# Patient Record
Sex: Male | Born: 1980 | Race: White | Hispanic: No | Smoking: Current every day smoker
Health system: Southern US, Community
[De-identification: ages and names within clinical notes are randomized; demographics above are authoritative.]

## PROBLEM LIST (undated history)

## (undated) DIAGNOSIS — R569 Unspecified convulsions: Secondary | ICD-10-CM

---

## 2004-08-03 ENCOUNTER — Emergency Department: Payer: Self-pay | Admitting: Emergency Medicine

## 2004-08-03 ENCOUNTER — Other Ambulatory Visit: Payer: Self-pay

## 2005-08-12 ENCOUNTER — Emergency Department: Payer: Self-pay | Admitting: Emergency Medicine

## 2007-05-31 ENCOUNTER — Emergency Department: Payer: Self-pay | Admitting: Emergency Medicine

## 2008-09-26 ENCOUNTER — Emergency Department: Payer: Self-pay | Admitting: Emergency Medicine

## 2008-09-29 ENCOUNTER — Emergency Department: Payer: Self-pay | Admitting: Internal Medicine

## 2010-07-27 ENCOUNTER — Emergency Department: Payer: Self-pay | Admitting: Emergency Medicine

## 2011-04-21 ENCOUNTER — Emergency Department: Payer: Self-pay | Admitting: Emergency Medicine

## 2011-04-22 ENCOUNTER — Emergency Department: Payer: Self-pay | Admitting: Emergency Medicine

## 2011-09-15 ENCOUNTER — Emergency Department: Payer: Self-pay | Admitting: Emergency Medicine

## 2011-12-18 ENCOUNTER — Emergency Department: Payer: Self-pay | Admitting: *Deleted

## 2012-08-12 ENCOUNTER — Emergency Department: Payer: Self-pay | Admitting: Internal Medicine

## 2012-09-11 ENCOUNTER — Emergency Department: Payer: Self-pay | Admitting: Emergency Medicine

## 2013-02-16 ENCOUNTER — Emergency Department: Payer: Self-pay | Admitting: Emergency Medicine

## 2013-02-16 LAB — COMPREHENSIVE METABOLIC PANEL
Alkaline Phosphatase: 68 U/L (ref 50–136)
BUN: 15 mg/dL (ref 7–18)
Chloride: 108 mmol/L — ABNORMAL HIGH (ref 98–107)
Co2: 31 mmol/L (ref 21–32)
EGFR (African American): >60
EGFR (Non-African Amer.): >60
Osmolality: 285 (ref 275–301)
SGOT(AST): 19 U/L (ref 15–37)
SGPT (ALT): 27 U/L (ref 12–78)
Sodium: 142 mmol/L (ref 136–145)

## 2013-02-16 LAB — CBC
HCT: 48.9 % (ref 40.0–52.0)
HGB: 16.7 g/dL (ref 13.0–18.0)
MCH: 31.5 pg (ref 26.0–34.0)
MCHC: 34.1 g/dL (ref 32.0–36.0)
MCV: 92 fL (ref 80–100)
RBC: 5.3 10*6/uL (ref 4.40–5.90)
WBC: 12 10*3/uL — ABNORMAL HIGH (ref 3.8–10.6)

## 2013-02-16 LAB — ETHANOL: Ethanol: <3 mg/dL

## 2014-11-15 ENCOUNTER — Emergency Department: Payer: Self-pay | Admitting: Emergency Medicine

## 2015-04-30 ENCOUNTER — Emergency Department
Admission: EM | Admit: 2015-04-30 | Discharge: 2015-04-30 | Disposition: A | Discharge status: Home / Self Care | Payer: Self-pay | Attending: Emergency Medicine | Admitting: Emergency Medicine

## 2015-04-30 ENCOUNTER — Encounter: Payer: Self-pay | Admitting: Emergency Medicine

## 2015-04-30 DIAGNOSIS — K625 Hemorrhage of anus and rectum: Secondary | ICD-10-CM | POA: Insufficient documentation

## 2015-04-30 DIAGNOSIS — Z720 Tobacco use: Secondary | ICD-10-CM | POA: Insufficient documentation

## 2015-04-30 LAB — COMPREHENSIVE METABOLIC PANEL
ALT: 27 U/L (ref 17–63)
AST: 23 U/L (ref 15–41)
Albumin: 4.3 g/dL (ref 3.5–5.0)
Alkaline Phosphatase: 63 U/L (ref 38–126)
Anion gap: 7 (ref 5–15)
BUN: 13 mg/dL (ref 6–20)
CO2: 29 mmol/L (ref 22–32)
Calcium: 9.7 mg/dL (ref 8.9–10.3)
Chloride: 107 mmol/L (ref 101–111)
Creatinine, Ser: 1.05 mg/dL (ref 0.61–1.24)
Glucose, Bld: 93 mg/dL (ref 65–99)
POTASSIUM: 4.6 mmol/L (ref 3.5–5.1)
Sodium: 143 mmol/L (ref 135–145)
TOTAL PROTEIN: 7.1 g/dL (ref 6.5–8.1)

## 2015-04-30 LAB — CBC
HEMATOCRIT: 46.3 % (ref 40.0–52.0)
Hemoglobin: 15.4 g/dL (ref 13.0–18.0)
MCH: 31 pg (ref 26.0–34.0)
MCHC: 33.2 g/dL (ref 32.0–36.0)
MCV: 93.6 fL (ref 80.0–100.0)
Platelets: 280 10*3/uL (ref 150–440)
RBC: 4.95 MIL/uL (ref 4.40–5.90)
RDW: 13.3 % (ref 11.5–14.5)
WBC: 10.6 10*3/uL (ref 3.8–10.6)

## 2015-04-30 MED ORDER — DOCUSATE SODIUM 100 MG PO CAPS: 100.0000 mg | ORAL_CAPSULE | Freq: Every day | ORAL | Status: AC | PRN

## 2015-04-30 MED ORDER — HYDROCORTISONE ACETATE 25 MG RE SUPP: 25.0000 mg | Freq: Two times a day (BID) | RECTAL | Status: DC

## 2015-04-30 NOTE — ED Notes (Signed)
Pt presents with rectal bleeding after having BM, been going on for one year and has not been seen by a DR for it.

## 2015-04-30 NOTE — ED Provider Notes (Signed)
[facility] Emergency Department Provider Note     Time seen: ----------------------------------------- 7:03 PM on 04/30/2015 -----------------------------------------    I have reviewed the triage vital signs and the nursing notes.   HISTORY  Chief Complaint Rectal Bleeding    HPI Jeffrey Bryan is a 34 y.o. male who presents to ER with rectal bleeding after having a bowel movement. Patient states been going on for about a year off and on. Patient has never seen a doctor for this, bleeding has been significant at times, doesn't think he's lost a couple blood. Patient denies any other complaints. Has had some discomfort rectally but no severe pain.   History reviewed. No pertinent past medical history.  There are no active problems to display for this patient.   History reviewed. No pertinent past surgical history.  Allergies Tramadol  Social History Social History  Substance Use Topics  . Smoking status: Current Some Day Smoker  . Smokeless tobacco: None  . Alcohol Use: Yes    Review of Systems Constitutional: Negative for fever. Eyes: Negative for visual changes. ENT: Negative for sore throat. Cardiovascular: Negative for chest pain. Respiratory: Negative for shortness of breath. Gastrointestinal: Negative for abdominal pain, vomiting and diarrhea. Positive for rectal bleeding Genitourinary: Negative for dysuria. Musculoskeletal: Negative for back pain. Skin: Negative for rash. Neurological: Negative for headaches, focal weakness or numbness.  10-point ROS otherwise negative.  ____________________________________________   PHYSICAL EXAM:  VITAL SIGNS: ED Triage Vitals  Enc Vitals Group     BP 04/30/15 1808 123/86 mmHg     Pulse Rate 04/30/15 1808 84     Resp 04/30/15 1808 18     Temp 04/30/15 1808 98.4 F (36.9 C)     Temp Source 04/30/15 1808 Oral     SpO2 04/30/15 1808 79 %     Weight 04/30/15 1808 145 lb (65.772 kg)      Height 04/30/15 1808  (1.6 m)     Head Cir --      Peak Flow --      Pain Score --      Pain Loc --      Pain Edu? --      Excl. in GC? --     Constitutional: Alert and oriented. Well appearing and in no distress. Eyes: Conjunctivae are normal. PERRL. Normal extraocular movements. ENT   Head: Normocephalic and atraumatic.   Nose: No congestion/rhinnorhea.   Mouth/Throat: Mucous membranes are moist.   Neck: No stridor. Cardiovascular: Normal rate, regular rhythm. Normal and symmetric distal pulses are present in all extremities. No murmurs, rubs, or gallops. Respiratory: Normal respiratory effort without tachypnea nor retractions. Breath sounds are clear and equal bilaterally. No wheezes/rales/rhonchi. Gastrointestinal: Soft and nontender. No distention. No abdominal bruits.  Rectal: No internal or external hemorrhoids, heme positive stool. Nontender. Musculoskeletal: Nontender with normal range of motion in all extremities. No joint effusions.  No lower extremity tenderness nor edema. Neurologic:  Normal speech and language. No gross focal neurologic deficits are appreciated. Speech is normal. No gait instability. Skin:  Skin is warm, dry and intact. No rash noted. Psychiatric: Mood and affect are normal. Speech and behavior are normal. Patient exhibits appropriate insight and judgment.  ____________________________________________  ED COURSE:  Pertinent labs & imaging results that were available during my care of the patient were reviewed by me and considered in my medical decision making (see chart for details). We'll check basic labs and reevaluate. ____________________________________________    LABS (pertinent  positives/negatives)  Labs Reviewed  COMPREHENSIVE METABOLIC PANEL - Abnormal; Notable for the following:    Total Bilirubin <0.1 (*)    All other components within normal limits  CBC  POC OCCULT BLOOD, ED    ____________________________________________  FINAL ASSESSMENT AND PLAN  Rectal bleeding  Plan: Patient with labs as dictated above. No clear etiology for his symptoms. He was heme positive, we'll try Anusol HC for anal outlet bleeding and referred to GI for follow-up.   Emily Filbert, MD   Emily Filbert, MD 04/30/15 [phone]

## 2015-04-30 NOTE — Discharge Instructions (Signed)
Rectal Bleeding °Rectal bleeding is when blood passes out of the anus. It is usually a sign that something is wrong. It may not be serious, but it should always be evaluated. Rectal bleeding may present as bright red blood or extremely dark stools. The color may range from dark red or maroon to black (like tar). It is important that the cause of rectal bleeding be identified so treatment can be started and the problem corrected. °CAUSES  °· Hemorrhoids. These are enlarged (dilated) blood vessels or veins in the anal or rectal area. °· Fistulas. These are abnormal, burrowing channels that usually run from inside the rectum to the skin around the anus. They can bleed. °· Anal fissures. This is a tear in the tissue of the anus. Bleeding occurs with bowel movements. °· Diverticulosis. This is a condition in which pockets or sacs project from the bowel wall. Occasionally, the sacs can bleed. °· Diverticulitis. This is an infection involving diverticulosis of the colon. °· Proctitis and colitis. These are conditions in which the rectum, colon, or both, can become inflamed and pitted (ulcerated). °· Polyps and cancer. Polyps are non-cancerous (benign) growths in the colon that may bleed. Certain types of polyps turn into cancer. °· Protrusion of the rectum. Part of the rectum can project from the anus and bleed. °· Certain medicines. °· Intestinal infections. °· Blood vessel abnormalities. °HOME CARE INSTRUCTIONS °· Eat a high-fiber diet to keep your stool soft. °· Limit activity. °· Drink enough fluids to keep your urine clear or pale yellow. °· Warm baths may be useful to soothe rectal pain. °· Follow up with your caregiver as directed. °SEEK IMMEDIATE MEDICAL CARE IF: °· You develop increased bleeding. °· You have black or dark red stools. °· You vomit blood or material that looks like coffee grounds. °· You have abdominal pain or tenderness. °· You have a fever. °· You feel weak, nauseous, or you faint. °· You have  severe rectal pain or you are unable to have a bowel movement. °MAKE SURE YOU: °· Understand these instructions. °· Will watch your condition. °· Will get help right away if you are not doing well or get worse. °Document Released: 01/29/2002 Document Revised: 11/01/2011 Document Reviewed: 01/24/2011 °ExitCare® Patient Information ©2015 [employer]. This information is not intended to replace advice given to you by your health care provider. Make sure you discuss any questions you have with your health care provider. ° °

## 2015-08-08 ENCOUNTER — Emergency Department
Admission: EM | Admit: 2015-08-08 | Discharge: 2015-08-09 | Disposition: A | Discharge status: Home / Self Care | Payer: Self-pay | Attending: Emergency Medicine | Admitting: Emergency Medicine

## 2015-08-08 ENCOUNTER — Encounter: Payer: Self-pay | Admitting: *Deleted

## 2015-08-08 ENCOUNTER — Emergency Department: Payer: Self-pay

## 2015-08-08 DIAGNOSIS — Z79899 Other long term (current) drug therapy: Secondary | ICD-10-CM | POA: Insufficient documentation

## 2015-08-08 DIAGNOSIS — G40909 Epilepsy, unspecified, not intractable, without status epilepticus: Secondary | ICD-10-CM | POA: Insufficient documentation

## 2015-08-08 DIAGNOSIS — F17210 Nicotine dependence, cigarettes, uncomplicated: Secondary | ICD-10-CM | POA: Insufficient documentation

## 2015-08-08 DIAGNOSIS — R569 Unspecified convulsions: Secondary | ICD-10-CM

## 2015-08-08 HISTORY — DX: Unspecified convulsions: R56.9

## 2015-08-08 NOTE — ED Notes (Signed)
Patient states this is his 4th seizure. He has never been on any medication and states, "I just thought they would go away".

## 2015-08-08 NOTE — ED Notes (Signed)
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 Pt was told "my eyes rolled back in head and I began shaking" time and length of seizure is unknown. Pt reports vomiting 4-5 times after seizure. Pt reports having a headache at this time. No neuro deficits known at this time. Pt reports having a hx of seizures ut denies taking medication at this time.

## 2015-08-08 NOTE — ED Notes (Signed)
Patient to CT.

## 2015-08-08 NOTE — ED Notes (Signed)
Patient returned from CT

## 2015-08-08 NOTE — ED Provider Notes (Signed)
[facility] Emergency Department Provider Note   ____________________________________________  Time seen:  I have reviewed the triage vital signs and the triage nursing note.  HISTORY  Chief Complaint Seizures   Historian Patient, mother, fianc  HPI Jeffrey Bryan is a 34 y.o. male who is here for evaluation of a seizure. 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 He has had about 4 seizures total, but has not had an evaluation for these. The last seizure he had was in August and before that was one year prior. He's not had head imaging in the past. He does not have insurance, and does not have a primary care physician and has never seen a neurologist. He has never taken epileptic medications.    Past Medical History  Diagnosis Date  . Seizures (HCC)     There are no active problems to display for this patient.   History reviewed. No pertinent past surgical history.  Current Outpatient Rx  Name  Route  Sig  Dispense  Refill  . docusate sodium (COLACE) 100 MG capsule   Oral   Take 1 capsule (100 mg total) by mouth daily as needed.   30 capsule   2   . hydrocortisone (ANUSOL-HC) 25 MG suppository   Rectal   Place 1 suppository (25 mg total) rectally 2 (two) times daily.   12 suppository   0     Allergies Tramadol  History reviewed. No pertinent family history.  Social History Social History  Substance Use Topics  . Smoking status: Current Every Day Smoker -- 1.00 packs/day    Types: Cigarettes  . Smokeless tobacco: None  . Alcohol Use: Yes    Review of Systems  Constitutional: Negative for fever. Eyes: Negative for visual changes. ENT: Negative for sore throat. Cardiovascular: Negative for chest pain. Respiratory: Negative for shortness of breath. Gastrointestinal: Negative for abdominal pain, vomiting and  diarrhea. Genitourinary: Negative for dysuria. Musculoskeletal: Negative for back pain. Skin: Negative for rash. Neurological: Negative for headache. 10 point Review of Systems otherwise negative ____________________________________________   PHYSICAL EXAM:  VITAL SIGNS: ED Triage Vitals  Enc Vitals Group     BP 08/08/15 2013 109/67 mmHg     Pulse Rate 08/08/15 2013 61     Resp 08/08/15 2013 18     Temp 08/08/15 2013 98.2 F (36.8 C)     Temp Source 08/08/15 2013 Oral     SpO2 08/08/15 2013 98 %     Weight 08/08/15 2013 145 lb (65.772 kg)     Height 08/08/15 2013  (1.626 m)     Head Cir --      Peak Flow --      Pain Score 08/08/15 2025 5     Pain Loc --      Pain Edu? --      Excl. in GC? --      Constitutional: Alert and oriented. Well appearing and in no distress. Eyes: Conjunctivae are normal. PERRL. Normal extraocular movements. ENT   Head: Normocephalic and atraumatic.   Nose: No congestion/rhinnorhea.   Mouth/Throat: Mucous membranes are moist.   Neck: No stridor. Cardiovascular/Chest: Normal rate, regular rhythm.  No murmurs, rubs, or gallops. Respiratory: Normal respiratory effort without tachypnea nor retractions. Breath sounds are clear and equal bilaterally. No wheezes/rales/rhonchi. Gastrointestinal: Soft. No distention, no guarding, no rebound. Nontender  Genitourinary/rectal:Deferred Musculoskeletal: Nontender with normal range of motion in all extremities. No joint effusions.  No lower extremity tenderness.  No edema. Neurologic:  Normal speech and language. No gross or focal neurologic deficits are appreciated. Skin:  Skin is warm, dry and intact. No rash noted. Psychiatric: Mood and affect are normal. Speech and behavior are normal. Patient exhibits appropriate insight and judgment.  ____________________________________________   EKG I, Governor Rooksebecca Ellice Boultinghouse, MD, the attending physician have personally viewed and interpreted all  ECGs.  None ____________________________________________  LABS (pertinent positives/negatives)  None  ____________________________________________  RADIOLOGY All Xrays were viewed by me. Imaging interpreted by Radiologist.  CT head: No acute intracranial abnormality __________________________________________  PROCEDURES  Procedure(s) performed: None  Critical Care performed: None  ____________________________________________   ED COURSE / ASSESSMENT AND PLAN  CONSULTATIONS: [facility], Dr. Amada JupiterKirkpatrick, who recommended starting the patient on Keppra versus Dilantin depending on the patient's report.  Pertinent labs & imaging results that were available during my care of the patient were reviewed by me and considered in my medical decision making (see chart for details).   Patient is here after probably his fourth seizure, but 2 seizures within the past 6 months. No additional high risk features, however the patient has not had underlying base imaging of his head, and he does not have insurance and is unlikely to be out of obtain an MRI as an outpatient, so we discussed obtaining head CT tonight.  CT head is negative.  Patient denies any withdrawal or illicit drug use that should be the source of his seizure.  I discussed this case with the neurologist, Dr. Amada JupiterKirkpatrick, who did recommend starting the patient on antibiotic. Recommended Keppra primarily, but patient cannot afford this, to could use Dilantin.  Using the good Rx coupon, patient should be able to get Keppra 1 month supply for about $25. Patient states he should be admitted to this.  I've referred him for [facility], and [facility], or [facility].  Patient / Family / Caregiver informed of clinical course, medical decision-making process, and agree with plan.   I discussed return precautions, follow-up instructions, and  discharged instructions with patient and/or family.  ___________________________________________   FINAL CLINICAL IMPRESSION(S) / ED DIAGNOSES   Final diagnoses:  Seizure (HCC)       Governor Rooksebecca Dulcey Riederer, MD 08/09/15 0030

## 2015-08-08 NOTE — ED Notes (Signed)
Patients family member states he has a bump on the back of his head that she would like to be evaluated. States patients father died of a brain tumor. Patient states it has been there his whole life and only hurts "sometimes".

## 2015-08-09 MED ORDER — LEVETIRACETAM 500 MG PO TABS
500.0000 mg | ORAL_TABLET | Freq: Once | ORAL | Status: AC
  Administered 2015-08-09: 500 mg via ORAL
  Filled 2015-08-09: qty 1

## 2015-08-09 MED ORDER — LEVETIRACETAM 500 MG PO TABS: 500.0000 mg | ORAL_TABLET | Freq: Two times a day (BID) | ORAL | Status: DC

## 2015-08-09 NOTE — Discharge Instructions (Signed)
You were evaluated after seizure, and you're being started on seizure medication. Next time we discussed, you need to follow up with a primary care physician and are referred to the [facility].  You also should see a neurologist, you may either see [facility], or [facility].  Return to the emergency department for a seizure within the next week 4 hours, or any seizure that lasts longer than 5 minutes, or where you do not come back to normal mental status within about 10 minutes.   Seizure, Adult A seizure means there is unusual activity in the brain. A seizure can cause changes in attention or behavior. Seizures often cause shaking (convulsions). Seizures often last from 30 seconds to 2 minutes. HOME CARE   If you are given medicines, take them exactly as told by your doctor.  Keep all doctor visits as told.  Do not swim or drive until your doctor says it is okay.  Teach others what to do if you have a seizure. They should:  Lay you on the ground.  Put a cushion under your head.  Loosen any tight clothing around your neck.  Turn you on your side.  Stay with you until you get better. GET HELP RIGHT AWAY IF:   The seizure lasts longer than 2 to 5 minutes.  The seizure is very bad.  The person does not wake up after the seizure.  The person's attention or behavior changes. Drive the person to the emergency room or call your local emergency services (911 in [place] MAKE SURE YOU:   Understand these instructions.  Will watch your condition.  Will get help right away if you are not doing well or get worse.   This information is not intended to replace advice given to you by your health care provider. Make sure you discuss any questions you have with your health care provider.   Document Released: 01/26/2008 Document Revised: 11/01/2011 Document Reviewed: 03/21/2013 Elsevier Interactive Patient Education [employer]

## 2015-12-13 ENCOUNTER — Encounter: Payer: Self-pay | Admitting: Urgent Care

## 2015-12-13 ENCOUNTER — Emergency Department
Admission: EM | Admit: 2015-12-13 | Discharge: 2015-12-13 | Disposition: A | Discharge status: Home / Self Care | Payer: Self-pay | Attending: Emergency Medicine | Admitting: Emergency Medicine

## 2015-12-13 DIAGNOSIS — Z79899 Other long term (current) drug therapy: Secondary | ICD-10-CM | POA: Insufficient documentation

## 2015-12-13 DIAGNOSIS — F1290 Cannabis use, unspecified, uncomplicated: Secondary | ICD-10-CM | POA: Insufficient documentation

## 2015-12-13 DIAGNOSIS — K029 Dental caries, unspecified: Secondary | ICD-10-CM

## 2015-12-13 DIAGNOSIS — K0889 Other specified disorders of teeth and supporting structures: Secondary | ICD-10-CM

## 2015-12-13 DIAGNOSIS — F17210 Nicotine dependence, cigarettes, uncomplicated: Secondary | ICD-10-CM | POA: Insufficient documentation

## 2015-12-13 MED ORDER — KETOROLAC TROMETHAMINE 60 MG/2ML IM SOLN
60.0000 mg | Freq: Once | INTRAMUSCULAR | Status: AC
  Administered 2015-12-13: 60 mg via INTRAMUSCULAR
  Filled 2015-12-13: qty 2

## 2015-12-13 NOTE — ED Notes (Signed)
Reviewed d/c instructions, follow-up care with pt. Pt  Verbalized understanding

## 2015-12-13 NOTE — ED Notes (Signed)
Webster, MD at bedside. 

## 2015-12-13 NOTE — Discharge Instructions (Signed)
Dental Caries °Dental caries (also called tooth decay) is the most common oral disease. It can occur at any age but is more common in children and young adults.  °HOW DENTAL CARIES DEVELOPS  °The process of decay begins when bacteria and foods (particularly sugars and starches) combine in your mouth to produce plaque. Plaque is a substance that sticks to the hard, outer surface of a tooth (enamel). The bacteria in plaque produce acids that attack enamel. These acids may also attack the root surface of a tooth (cementum) if it is exposed. Repeated attacks dissolve these surfaces and create holes in the tooth (cavities). If left untreated, the acids destroy the other layers of the tooth.  °RISK FACTORS °· Frequent sipping of sugary beverages.   °· Frequent snacking on sugary and starchy foods, especially those that easily get stuck in the teeth.   °· Poor oral hygiene.   °· Dry mouth.   °· Substance abuse such as methamphetamine abuse.   °· Broken or poor-fitting dental restorations.   °· Eating disorders.   °· Gastroesophageal reflux disease (GERD).   °· Certain radiation treatments to the head and neck. °SYMPTOMS °In the early stages of dental caries, symptoms are seldom present. Sometimes white, chalky areas may be seen on the enamel or other tooth layers. In later stages, symptoms may include: °· Pits and holes on the enamel. °· Toothache after sweet, hot, or cold foods or drinks are consumed. °· Pain around the tooth. °· Swelling around the tooth. °DIAGNOSIS  °Most of the time, dental caries is detected during a regular dental checkup. A diagnosis is made after a thorough medical and dental history is taken and the surfaces of your teeth are checked for signs of dental caries. Sometimes special instruments, such as lasers, are used to check for dental caries. Dental X-ray exams may be taken so that areas not visible to the eye (such as between the contact areas of the teeth) can be checked for cavities.    °TREATMENT  °If dental caries is in its early stages, it may be reversed with a fluoride treatment or an application of a remineralizing agent at the dental office. Thorough brushing and flossing at home is needed to aid these treatments. If it is in its later stages, treatment depends on the location and extent of tooth destruction:  °· If a small area of the tooth has been destroyed, the destroyed area will be removed and cavities will be filled with a material such as gold, silver amalgam, or composite resin.   °· If a large area of the tooth has been destroyed, the destroyed area will be removed and a cap (crown) will be fitted over the remaining tooth structure.   °· If the center part of the tooth (pulp) is affected, a procedure called a root canal will be needed before a filling or crown can be placed.   °· If most of the tooth has been destroyed, the tooth may need to be pulled (extracted). °HOME CARE INSTRUCTIONS °You can prevent, stop, or reverse dental caries at home by practicing good oral hygiene. Good oral hygiene includes: °· Thoroughly cleaning your teeth at least twice a day with a toothbrush and dental floss.   °· Using a fluoride toothpaste. A fluoride mouth rinse may also be used if recommended by your dentist or health care provider.   °· Restricting the amount of sugary and starchy foods and sugary liquids you consume.   °· Avoiding frequent snacking on these foods and sipping of these liquids.   °· Keeping regular visits with   a dentist for checkups and cleanings. PREVENTION   Practice good oral hygiene.  Consider a dental sealant. A dental sealant is a coating material that is applied by your dentist to the pits and grooves of teeth. The sealant prevents food from being trapped in them. It may protect the teeth for several years.  Ask about fluoride supplements if you live in a community without fluorinated water or with water that has a low fluoride content. Use fluoride supplements  as directed by your dentist or health care provider.  Allow fluoride varnish applications to teeth if directed by your dentist or health care provider.   This information is not intended to replace advice given to you by your health care provider. Make sure you discuss any questions you have with your health care provider.   Document Released: 05/01/2002 Document Revised: 08/30/2014 Document Reviewed: 08/11/2012 Elsevier Interactive Patient Education 2016 Elsevier Inc.  Dental Pain Dental pain may be caused by many things, including:  Tooth decay (cavities or caries). Cavities expose the nerve of your tooth to air and hot or cold temperatures. This can cause pain or discomfort.  Abscess or infection. A dental abscess is a collection of infected pus from a bacterial infection in the inner part of the tooth (pulp). It usually occurs at the end of the tooth's root.  Injury.  An unknown reason (idiopathic). Your pain may be mild or severe. It may only occur when:  You are chewing.  You are exposed to hot or cold temperature.  You are eating or drinking sugary foods or beverages, such as soda or candy. Your pain may also be constant. HOME CARE INSTRUCTIONS Watch your dental pain for any changes. The following actions may help to lessen any discomfort that you are feeling:  Take medicines only as directed by your dentist.  If you were prescribed an antibiotic medicine, finish all of it even if you start to feel better.  Keep all follow-up visits as directed by your dentist. This is important.  Do not apply heat to the outside of your face.  Rinse your mouth or gargle with salt water if directed by your dentist. This helps with pain and swelling.  You can make salt water by adding  tsp of salt to 1 cup of warm water.  Apply ice to the painful area of your face:  Put ice in a plastic bag.  Place a towel between your skin and the bag.  Leave the ice on for 20 minutes, 2-3  times per day.  Avoid foods or drinks that cause you pain, such as:  Very hot or very cold foods or drinks.  Sweet or sugary foods or drinks. SEEK MEDICAL CARE IF:  Your pain is not controlled with medicines.  Your symptoms are worse.  You have new symptoms. SEEK IMMEDIATE MEDICAL CARE IF:  You are unable to open your mouth.  You are having trouble breathing or swallowing.  You have a fever.  Your face, neck, or jaw is swollen.   This information is not intended to replace advice given to you by your health care provider. Make sure you discuss any questions you have with your health care provider.   Document Released: 08/09/2005 Document Revised: 12/24/2014 Document Reviewed: 08/05/2014 Elsevier Interactive Patient Education 2016 [employer].  OPTIONS FOR DENTAL FOLLOW UP CARE  [place] Department of Health and Human Services - Local Safety Net Dental Clinics [email or web address]   [facility] [phone])  Sharl Ma [phone])  [facility] [phone])  [facility] [phone])  [facility] [phone]) This clinic caters to the indigent population and is on a lottery system. Location: [employer] [address], [address], [place] [facility] Hours: Wednesdays from 6pm - 9pm, patients seen by a lottery system. For dates, call or go to [email or web address] Services: Cleanings, fillings and simple extractions. Payment Options: DENTAL WORK IS FREE OF CHARGE. Bring proof of income or support. Best way to get seen: Arrive at 5:15 pm - this is a lottery, NOT first come/first serve, so arriving earlier will not increase your chances of being seen.     [facility] [phone] Select option 1 for emergencies   Location: [facility], [address], [address],  [place] [facility] Hours: No walk-ins accepted - call the day before to schedule an appointment. Check in times are 9:30 am and 1:30 pm. Services: Simple extractions, temporary fillings, pulpectomy/pulp debridement, uncomplicated abscess drainage. Payment Options: PAYMENT IS DUE AT THE TIME OF SERVICE.  Fee is usually $100-200, additional surgical procedures (e.g. abscess drainage) may be extra. Cash, checks, Visa/MasterCard accepted.  Can file Medicaid if patient is covered for dental - patient should call case worker to check. No discount for [facility] patients. Best way to get seen: MUST call the day before and get onto the schedule. Can usually be seen the next 1-2 days. No walk-ins accepted.     [facility] [phone]   Location: [facility], [address], [place] [facility] Hours: M, W, Th, F 8am or 1:30pm, Tues 9a or 1:30 - first come/first served. Services: Simple extractions, temporary fillings, uncomplicated abscess drainage.  You do not need to be an [facility] resident. Payment Options: PAYMENT IS DUE AT THE TIME OF SERVICE. Dental insurance, otherwise sliding scale - bring proof of income or support. Depending on income and treatment needed, cost is usually $50-200. Best way to get seen: Arrive early as it is first come/first served.     [facility] [phone]   Location: [address] Clinic Hours: Mon-Thu 8a-5p Services: Most basic dental services including extractions and fillings. Payment Options: PAYMENT IS DUE AT THE TIME OF SERVICE. Sliding scale, up to 50% off - bring proof if income or support. Medicaid with dental option accepted. Best way to get seen: Call to schedule an appointment, can usually be seen within 2 weeks OR they will try to see walk-ins - show up at 8a or 2p (you may have to wait).     [facility] [phone] [place]  RESIDENTS ONLY   Location: [facility], [address], [place], [place] [address] Clinic Hours: By appointment only. Monday - Thursday 8am-5pm, Friday 8am-12pm Services: Cleanings, fillings, extractions. Payment Options: PAYMENT IS DUE AT THE TIME OF SERVICE. Cash, Visa or MasterCard. Sliding scale - $30 minimum per service. Best way to get seen: Come in to office, complete packet and make an appointment - need proof of income or support monies for each household member and proof of [facility] [place] residence. Usually takes about a month to get in.     [facility] [phone]   Location: [address] [facility] Hours: Walk-in Urgent Care Dental Services are offered Monday-Friday mornings only. The numbers of emergencies accepted daily is limited to the number of providers available. Maximum 15 - Mondays, Wednesdays &  Thursdays Maximum 10 - Tuesdays & Fridays Services: You do not need to be a [facility] resident to be seen for a dental emergency. Emergencies are defined as pain, swelling, abnormal bleeding, or dental trauma. Walkins will receive x-rays if needed. NOTE: Dental cleaning is not an emergency. Payment Options: PAYMENT IS DUE AT THE TIME OF SERVICE. Minimum co-pay is $40.00 for uninsured patients. Minimum co-pay is $3.00 for Medicaid with dental coverage. Dental Insurance is accepted and must be presented at time of visit. Medicare does not cover dental. Forms of payment: Cash, credit card, checks. Best way to get seen: If not previously registered with the clinic, walk-in dental registration begins at 7:15 am and is on a first come/first serve basis. If previously registered with the clinic, call to make an appointment.     [facility] [phone] [place] RESIDENTS ONLY   Location: [address] [place], [place] Clinic Hours: Mon-Thu 10a-2p Services: Extractions only! Payment  Options: FREE (donations accepted) - bring proof of income or support Best way to get seen: Call and schedule an appointment OR come at 8am on the 1st Monday of every month (except for holidays) when it is first come/first served.     [facility] [phone]   Location: [address] [place], [place] Clinic Hours: Friday mornings Services, Payment Options, Best way to get seen: Call for info

## 2015-12-13 NOTE — ED Notes (Signed)
Patient presents with c/o RIGHT lower jaw pain x 3 weeks. Patient denies fever. No appreciable swelling in triage. Visitor states, "He needs it taken out". RN explained to patient and visitor that the ED did not have a dentist on staff, therefore he would be referred for removal. No local or preferred dentist.

## 2015-12-13 NOTE — ED Provider Notes (Signed)
[facility] Emergency Department Provider Note  ____________________________________________  Time seen: Approximately 142 AM  I have reviewed the triage vital signs and the nursing notes.   HISTORY  Chief Complaint Dental Pain    HPI Jeffrey Bryan is a 35 y.o. male who comes in with some dental pain. The patient is having some left upper tooth pain. He reports that he's been having this pain for 3 weeks. He reports that tonight it became unbearable. The patient reports that he has been taking ibuprofen for the pain but it has not been helping. He reports that takes only for a few minutes and then the pain comes back. He reports that he tries to hold his breath and blood pressure on it but the pain returns. The patient's pain is a 9 out of 10 in intensity. He denies any fevers or any other symptoms. The patient is here because he wants to have this tooth evaluated.   Past Medical History  Diagnosis Date  . Seizures (HCC)     There are no active problems to display for this patient.   History reviewed. No pertinent past surgical history.  Current Outpatient Rx  Name  Route  Sig  Dispense  Refill  . docusate sodium (COLACE) 100 MG capsule   Oral   Take 1 capsule (100 mg total) by mouth daily as needed.   30 capsule   2   . hydrocortisone (ANUSOL-HC) 25 MG suppository   Rectal   Place 1 suppository (25 mg total) rectally 2 (two) times daily.   12 suppository   0   . levETIRAcetam (KEPPRA) 500 MG tablet   Oral   Take 1 tablet (500 mg total) by mouth 2 (two) times daily.   60 tablet   0     Allergies Tramadol  No family history on file.  Social History Social History  Substance Use Topics  . Smoking status: Current Every Day Smoker -- 1.00 packs/day    Types: Cigarettes  . Smokeless tobacco: None  . Alcohol Use: Yes    Review of Systems Constitutional: No fever/chills Eyes: No visual changes. ENT: dental pain Cardiovascular:  Denies chest pain. Respiratory: Denies shortness of breath. Gastrointestinal: No abdominal pain.  No nausea, no vomiting.  No diarrhea.  No constipation. Genitourinary: Negative for dysuria. Musculoskeletal: Negative for back pain. Skin: Negative for rash. Neurological: Negative for headaches, focal weakness or numbness.  10-point ROS otherwise negative.  ____________________________________________   PHYSICAL EXAM:  VITAL SIGNS: ED Triage Vitals  Enc Vitals Group     BP 12/13/15 0014 127/79 mmHg     Pulse Rate 12/13/15 0014 81     Resp 12/13/15 0014 20     Temp 12/13/15 0014 97.8 F (36.6 C)     Temp Source 12/13/15 0014 Oral     SpO2 12/13/15 0014 99 %     Weight 12/13/15 0014 150 lb (68.04 kg)     Height 12/13/15 0014  (1.626 m)     Head Cir --      Peak Flow --      Pain Score 12/13/15 0014 10     Pain Loc --      Pain Edu? --      Excl. in GC? --     Constitutional: Alert and oriented. Well appearing and in Moderate distress. Eyes: Conjunctivae are normal. PERRL. EOMI. Head: Atraumatic. Nose: No congestion/rhinnorhea. Mouth/Throat: Mucous membranes are moist.  Oropharynx non-erythematous. Broken tooth at third  molar on the right maxillary jaw Cardiovascular: Normal rate, regular rhythm. Grossly normal heart sounds.  Good peripheral circulation. Respiratory: Normal respiratory effort.  No retractions. Lungs CTAB. Gastrointestinal: Soft and nontender. No distention. Positive bowel sounds Musculoskeletal: No lower extremity tenderness nor edema.   Neurologic:  Normal speech and language.  Skin:  Skin is warm, dry and intact.  Psychiatric: Mood and affect are normal.   ____________________________________________   LABS (all labs ordered are listed, but only abnormal results are displayed)  Labs Reviewed - No data to  display ____________________________________________  EKG  None ____________________________________________  RADIOLOGY  None ____________________________________________   PROCEDURES  Procedure(s) performed: None  Critical Care performed: No  ____________________________________________   INITIAL IMPRESSION / ASSESSMENT AND PLAN / ED COURSE  Pertinent labs & imaging results that were available during my care of the patient were reviewed by me and considered in my medical decision making (see chart for details).  This is a 35 year old male who comes into the hospital today with some dental pain. As were looking through the patient's care is aware we noted that the [facility] with [facility] same pain on 418. The patient did not disclose that he had been seen until I asked him. The patient was also given 15 hydrocodone as well as amoxicillin and ibuprofen while there. The [facility]. They report that he did call but was placed on the waiting list. I informed him that I can give him a dose of Toradol but because he did not disclose he had been taking narcotic medication and he has completed them before the allotted date I am unable to give him any further narcotics. The patient is still taking his amoxicillin as well. I encouraged him to [facility]. The patient will be discharged home. ____________________________________________   FINAL CLINICAL IMPRESSION(S) / ED DIAGNOSES  Final diagnoses:  Pain due to dental caries  Pain, dental      Rebecka ApleyAllison P Asmar Brozek, MD 12/13/15 [phone]

## 2016-02-01 ENCOUNTER — Encounter: Payer: Self-pay | Admitting: Emergency Medicine

## 2016-02-01 ENCOUNTER — Emergency Department
Admission: EM | Admit: 2016-02-01 | Discharge: 2016-02-01 | Disposition: A | Discharge status: Home / Self Care | Payer: Self-pay | Attending: Emergency Medicine | Admitting: Emergency Medicine

## 2016-02-01 ENCOUNTER — Emergency Department: Payer: Self-pay

## 2016-02-01 DIAGNOSIS — F1290 Cannabis use, unspecified, uncomplicated: Secondary | ICD-10-CM | POA: Insufficient documentation

## 2016-02-01 DIAGNOSIS — Z8669 Personal history of other diseases of the nervous system and sense organs: Secondary | ICD-10-CM | POA: Insufficient documentation

## 2016-02-01 DIAGNOSIS — Y999 Unspecified external cause status: Secondary | ICD-10-CM | POA: Insufficient documentation

## 2016-02-01 DIAGNOSIS — W1789XA Other fall from one level to another, initial encounter: Secondary | ICD-10-CM | POA: Insufficient documentation

## 2016-02-01 DIAGNOSIS — F17210 Nicotine dependence, cigarettes, uncomplicated: Secondary | ICD-10-CM | POA: Insufficient documentation

## 2016-02-01 DIAGNOSIS — S8001XA Contusion of right knee, initial encounter: Secondary | ICD-10-CM | POA: Insufficient documentation

## 2016-02-01 DIAGNOSIS — Y92009 Unspecified place in unspecified non-institutional (private) residence as the place of occurrence of the external cause: Secondary | ICD-10-CM | POA: Insufficient documentation

## 2016-02-01 DIAGNOSIS — Y939 Activity, unspecified: Secondary | ICD-10-CM | POA: Insufficient documentation

## 2016-02-01 MED ORDER — NABUMETONE 750 MG PO TABS: 750.0000 mg | ORAL_TABLET | Freq: Two times a day (BID) | ORAL | Status: DC

## 2016-02-01 MED ORDER — OXYCODONE-ACETAMINOPHEN 5-325 MG PO TABS
ORAL_TABLET | ORAL | Status: AC
  Filled 2016-02-01: qty 1

## 2016-02-01 MED ORDER — CYCLOBENZAPRINE HCL 5 MG PO TABS: 5.0000 mg | ORAL_TABLET | Freq: Three times a day (TID) | ORAL | Status: DC | PRN

## 2016-02-01 MED ORDER — OXYCODONE-ACETAMINOPHEN 5-325 MG PO TABS
1.0000 | ORAL_TABLET | ORAL | Status: DC | PRN
  Administered 2016-02-01: 1 via ORAL

## 2016-02-01 NOTE — Discharge Instructions (Signed)
Contusion A contusion is a deep bruise. Contusions happen when an injury causes bleeding under the skin. Symptoms of bruising include pain, swelling, and discolored skin. The skin may turn blue, purple, or yellow. HOME CARE   Rest the injured area.  If told, put ice on the injured area.  Put ice in a plastic bag.  Place a towel between your skin and the bag.  Leave the ice on for 20 minutes, 2-3 times per day.  If told, put light pressure (compression) on the injured area using an elastic bandage. Make sure the bandage is not too tight. Remove it and put it back on as told by your doctor.  If possible, raise (elevate) the injured area above the level of your heart while you are sitting or lying down.  Take over-the-counter and prescription medicines only as told by your doctor. GET HELP IF:  Your symptoms do not get better after several days of treatment.  Your symptoms get worse.  You have trouble moving the injured area. GET HELP RIGHT AWAY IF:   You have very bad pain.  You have a loss of feeling (numbness) in a hand or foot.  Your hand or foot turns pale or cold.   This information is not intended to replace advice given to you by your health care provider. Make sure you discuss any questions you have with your health care provider.   Document Released: 01/26/2008 Document Revised: 04/30/2015 Document Reviewed: 12/25/2014 Elsevier Interactive Patient Education 2016 [employer]   Take the prescription med as directed. Apply ice to reduce symptoms. Wear the ace wrap for comfort. [facility] as needed.

## 2016-02-01 NOTE — ED Notes (Signed)
Patient states that he fell off his porch onto his right knee yesterday. Patient reports taking tylenol and ibu with no relief.

## 2016-02-02 NOTE — ED Provider Notes (Signed)
[facility] Emergency Department Provider Note ____________________________________________  Time seen: 2024  I have reviewed the triage vital signs and the nursing notes.  HISTORY  Chief Complaint  Knee Pain  HPI Jeffrey Bryan is a 35 y.o. male presents to the ED for evaluation of injury sustained 1 day after he claims to have fallen off of his deck at home. He describes he was outside smoking a cigarette on the deck, but denies any preceding injury, accident, or sprain. He describes falling forward landing primarily on the right knee. He reports he's had pain and disability since that time. He is present with his wife for evaluation of right knee pain, and rates his pain at 7/10 in triage. He denies any other injury at this time.  Past Medical History  Diagnosis Date  . Seizures (HCC)     There are no active problems to display for this patient.   History reviewed. No pertinent past surgical history.  Current Outpatient Rx  Name  Route  Sig  Dispense  Refill  . cyclobenzaprine (FLEXERIL) 5 MG tablet   Oral   Take 1 tablet (5 mg total) by mouth every 8 (eight) hours as needed for muscle spasms.   12 tablet   0   . docusate sodium (COLACE) 100 MG capsule   Oral   Take 1 capsule (100 mg total) by mouth daily as needed.   30 capsule   2   . hydrocortisone (ANUSOL-HC) 25 MG suppository   Rectal   Place 1 suppository (25 mg total) rectally 2 (two) times daily.   12 suppository   0   . levETIRAcetam (KEPPRA) 500 MG tablet   Oral   Take 1 tablet (500 mg total) by mouth 2 (two) times daily.   60 tablet   0   . nabumetone (RELAFEN) 750 MG tablet   Oral   Take 1 tablet (750 mg total) by mouth 2 (two) times daily.   30 tablet   0     Allergies Tramadol  No family history on file.  Social History Social History  Substance Use Topics  . Smoking status: Current Every Day Smoker -- 1.00 packs/day    Types: Cigarettes  . Smokeless tobacco:  None  . Alcohol Use: Yes     Comment: rarely    Review of Systems  Constitutional: Negative for fever. Musculoskeletal: Negative for back pain. Right knee pain as above Skin: Negative for rash. Neurological: Negative for headaches, focal weakness or numbness. ____________________________________________  PHYSICAL EXAM:  VITAL SIGNS: ED Triage Vitals  Enc Vitals Group     BP 02/01/16 1926 117/72 mmHg     Pulse Rate 02/01/16 1926 76     Resp 02/01/16 1926 18     Temp 02/01/16 1926 98 F (36.7 C)     Temp Source 02/01/16 2047 Oral     SpO2 02/01/16 1926 97 %     Weight 02/01/16 1926 135 lb (61.236 kg)     Height 02/01/16 1926  (1.651 m)     Head Cir --      Peak Flow --      Pain Score 02/01/16 1927 7     Pain Loc --      Pain Edu? --      Excl. in GC? --    Constitutional: Alert and oriented. Well appearing and in no distress. Head: Normocephalic and atraumatic. Cardiovascular: Normal distal pulses. Respiratory: Normal respiratory effort.  Musculoskeletal: Right knee  without any obvious deformity, effusion, dislocation, or abrasion. The patient is noted to have normal flexion and extension range of motion. No valgus or varus joint stress is appreciated. Negative anterior/ posterior drawer. Normal patellar tracking without laxity or crepitus. No popliteal space fullness is appreciated. No calf or Achilles tenderness is noted. Nontender with normal range of motion in all extremities.  Neurologic:  Mildly antalgic gait without ataxia. Normal speech and language. No gross focal neurologic deficits are appreciated. Skin:  Skin is warm, dry and intact. No rash noted. No bruise, ecchymosis, abrasion, or laceration.  ____________________________________________   RADIOLOGY  Right Knee IMPRESSION: No evidence of fracture or dislocation.  I, Prathik Aman, Charlesetta IvoryJenise V Bacon, personally viewed and evaluated these images (plain radiographs) as part of my medical decision making, as  well as reviewing the written report by the radiologist. ____________________________________________  PROCEDURES  Ace bandage to knee ____________________________________________  INITIAL IMPRESSION / ASSESSMENT AND PLAN / ED COURSE  Patient with a normal exam without evidence of dislocation, fracture, effusion, or internal derangement. He is placed in Ace bandage for support and discharged with prescriptions for Relafen and Flexeril. He should apply ice as necessary for continued pain relief. A work note is provided for 1 day as requested. He will [facility] for ongoing symptom management. ____________________________________________  FINAL CLINICAL IMPRESSION(S) / ED DIAGNOSES  Final diagnoses:  Knee contusion, right, initial encounter     Lissa HoardJenise V Bacon Jennie Bolar, PA-C 02/02/16 0009  Sharyn CreamerMark Quale, MD 02/03/16 0010

## 2016-03-09 ENCOUNTER — Emergency Department
Admission: EM | Admit: 2016-03-09 | Discharge: 2016-03-09 | Disposition: A | Discharge status: Home / Self Care | Payer: Self-pay | Attending: Emergency Medicine | Admitting: Emergency Medicine

## 2016-03-09 ENCOUNTER — Emergency Department: Payer: Self-pay

## 2016-03-09 ENCOUNTER — Encounter: Payer: Self-pay | Admitting: Emergency Medicine

## 2016-03-09 DIAGNOSIS — Y999 Unspecified external cause status: Secondary | ICD-10-CM | POA: Insufficient documentation

## 2016-03-09 DIAGNOSIS — Z79899 Other long term (current) drug therapy: Secondary | ICD-10-CM | POA: Insufficient documentation

## 2016-03-09 DIAGNOSIS — F17210 Nicotine dependence, cigarettes, uncomplicated: Secondary | ICD-10-CM | POA: Insufficient documentation

## 2016-03-09 DIAGNOSIS — F1290 Cannabis use, unspecified, uncomplicated: Secondary | ICD-10-CM | POA: Insufficient documentation

## 2016-03-09 DIAGNOSIS — W1809XA Striking against other object with subsequent fall, initial encounter: Secondary | ICD-10-CM | POA: Insufficient documentation

## 2016-03-09 DIAGNOSIS — Y9389 Activity, other specified: Secondary | ICD-10-CM | POA: Insufficient documentation

## 2016-03-09 DIAGNOSIS — S8001XA Contusion of right knee, initial encounter: Secondary | ICD-10-CM | POA: Insufficient documentation

## 2016-03-09 DIAGNOSIS — Y929 Unspecified place or not applicable: Secondary | ICD-10-CM | POA: Insufficient documentation

## 2016-03-09 MED ORDER — NAPROXEN 500 MG PO TABS: 500.0000 mg | ORAL_TABLET | Freq: Two times a day (BID) | ORAL | Status: DC

## 2016-03-09 NOTE — Discharge Instructions (Signed)

## 2016-03-09 NOTE — ED Notes (Signed)
Patient ambulatory to triage with steady gait, without difficulty or distress noted; pt reports fell getting out of car this morning injuring left knee; c/o pain since

## 2016-03-09 NOTE — ED Provider Notes (Signed)
[facility] Emergency Department Provider Note    ____________________________________________  Time seen: Approximately 7:15 AM  I have reviewed the triage vital signs and the nursing notes.   HISTORY  Chief Complaint Knee Pain    HPI Jeffrey Bryan is a 35 y.o. male who presents today after a fall on his right knee. Patient states that this AM his foot got caught in his seat belt and subsequently fell out of his car and hit his knee on the concrete. Patient was able to limp on th extremity after the incident. Patient denies any numbness or tingling into the foot. He states that he is able to move his foot without pain as well. Current pain is 8/10.   Past Medical History  Diagnosis Date  . Seizures (HCC)     There are no active problems to display for this patient.   History reviewed. No pertinent past surgical history.  Current Outpatient Rx  Name  Route  Sig  Dispense  Refill  . cyclobenzaprine (FLEXERIL) 5 MG tablet   Oral   Take 1 tablet (5 mg total) by mouth every 8 (eight) hours as needed for muscle spasms.   12 tablet   0   . docusate sodium (COLACE) 100 MG capsule   Oral   Take 1 capsule (100 mg total) by mouth daily as needed.   30 capsule   2   . hydrocortisone (ANUSOL-HC) 25 MG suppository   Rectal   Place 1 suppository (25 mg total) rectally 2 (two) times daily.   12 suppository   0   . levETIRAcetam (KEPPRA) 500 MG tablet   Oral   Take 1 tablet (500 mg total) by mouth 2 (two) times daily.   60 tablet   0   . nabumetone (RELAFEN) 750 MG tablet   Oral   Take 1 tablet (750 mg total) by mouth 2 (two) times daily.   30 tablet   0   . naproxen (NAPROSYN) 500 MG tablet   Oral   Take 1 tablet (500 mg total) by mouth 2 (two) times daily with a meal.   60 tablet   0     Allergies Tramadol  No family history on file.  Social History Social History  Substance Use Topics  . Smoking status: Current Every Day  Smoker -- 1.00 packs/day    Types: Cigarettes  . Smokeless tobacco: None  . Alcohol Use: Yes     Comment: rarely    Review of Systems Constitutional: No fever/chills Eyes: No visual changes. ENT: No sore throat. Cardiovascular: Denies chest pain. Respiratory: Denies shortness of breath. Gastrointestinal: No abdominal pain.  No nausea, no vomiting.  No diarrhea.  No constipation. Genitourinary: Negative for dysuria. Musculoskeletal: Negative for back pain. Patient has pain directly over right knee cap Skin: Negative for rash. Neurological: Negative for headaches, focal weakness or numbness.  10-point ROS otherwise negative.  ____________________________________________   PHYSICAL EXAM:  VITAL SIGNS: ED Triage Vitals  Enc Vitals Group     BP 03/09/16 0657 102/68 mmHg     Pulse Rate 03/09/16 0657 86     Resp 03/09/16 0657 18     Temp 03/09/16 0657 97.6 F (36.4 C)     Temp Source 03/09/16 0657 Oral     SpO2 03/09/16 0657 99 %     Weight 03/09/16 0655 150 lb (68.04 kg)     Height 03/09/16 0655  (1.651 m)     Head  Cir --      Peak Flow --      Pain Score 03/09/16 0654 8     Pain Loc --      Pain Edu? --      Excl. in GC? --     Constitutional: Alert and oriented. Well appearing and in no acute distress. Eyes: Conjunctivae are normal. PERRL. EOMI. Head: Atraumatic. Nose: No congestion/rhinnorhea. Mouth/Throat: Mucous membranes are moist.  Oropharynx non-erythematous. Neck: No stridor.   Cardiovascular: Normal rate, regular rhythm. Grossly normal heart sounds.  Good peripheral circulation. Respiratory: Normal respiratory effort.  No retractions. Lungs CTAB. Gastrointestinal: Soft and nontender. No distention. No abdominal bruits. No CVA tenderness. Musculoskeletal: Patient has moderate pain to palpation over knee cap. He is able to move his ankle and toes without pain. Strength 4/5 Neurologic:  Normal speech and language. No gross focal neurologic deficits are  appreciated. No gait instability. Skin:  Skin is warm, dry and intact. No rash noted. Psychiatric: Mood and affect are normal. Speech and behavior are normal.  ____________________________________________   LABS (all labs ordered are listed, but only abnormal results are displayed)  Labs Reviewed - No data to display ____________________________________________  EKG  Not indicated ____________________________________________  RADIOLOGY  FINDINGS: No evidence of fracture, dislocation, or joint effusion. No evidence of arthropathy or other focal bone abnormality. Soft tissues are unremarkable.  IMPRESSION: Negative. ____________________________________________   PROCEDURES  Procedure(s) performed: None  Procedures  Critical Care performed: No  ____________________________________________   INITIAL IMPRESSION / ASSESSMENT AND PLAN / ED COURSE  Pertinent labs & imaging results that were available during my care of the patient were reviewed by me and considered in my medical decision making (see chart for details).  Patient has contusion to the right knee. There were no osseous changes on x-ray. Patient is prescribed a pain medication and told rest and icing the area would be the most beneficial treatment plan. Patient is agreeable. ____________________________________________   FINAL CLINICAL IMPRESSION(S) / ED DIAGNOSES  Final diagnoses:  Knee contusion, right, initial encounter      NEW MEDICATIONS STARTED DURING THIS VISIT:  New Prescriptions   NAPROXEN (NAPROSYN) 500 MG TABLET    Take 1 tablet (500 mg total) by mouth 2 (two) times daily with a meal.     Note:  This document was prepared using Dragon voice recognition software and may include unintentional dictation errors.   Evangeline Dakinharles M Nao Linz, PA-C 03/09/16 0756  Charlynne Panderavid Hsienta Yao, MD 03/09/16 [phone]

## 2017-01-21 ENCOUNTER — Emergency Department
Admission: EM | Admit: 2017-01-21 | Discharge: 2017-01-21 | Disposition: A | Discharge status: Home / Self Care | Payer: Self-pay | Attending: Emergency Medicine | Admitting: Emergency Medicine

## 2017-01-21 ENCOUNTER — Emergency Department: Payer: Self-pay

## 2017-01-21 ENCOUNTER — Encounter: Payer: Self-pay | Admitting: Emergency Medicine

## 2017-01-21 DIAGNOSIS — S93401A Sprain of unspecified ligament of right ankle, initial encounter: Secondary | ICD-10-CM | POA: Insufficient documentation

## 2017-01-21 DIAGNOSIS — X509XXA Other and unspecified overexertion or strenuous movements or postures, initial encounter: Secondary | ICD-10-CM | POA: Insufficient documentation

## 2017-01-21 DIAGNOSIS — F17210 Nicotine dependence, cigarettes, uncomplicated: Secondary | ICD-10-CM | POA: Insufficient documentation

## 2017-01-21 DIAGNOSIS — Z79899 Other long term (current) drug therapy: Secondary | ICD-10-CM | POA: Insufficient documentation

## 2017-01-21 DIAGNOSIS — R55 Syncope and collapse: Secondary | ICD-10-CM | POA: Insufficient documentation

## 2017-01-21 DIAGNOSIS — Y929 Unspecified place or not applicable: Secondary | ICD-10-CM | POA: Insufficient documentation

## 2017-01-21 DIAGNOSIS — Y999 Unspecified external cause status: Secondary | ICD-10-CM | POA: Insufficient documentation

## 2017-01-21 DIAGNOSIS — Y939 Activity, unspecified: Secondary | ICD-10-CM | POA: Insufficient documentation

## 2017-01-21 LAB — URINALYSIS, COMPLETE (UACMP) WITH MICROSCOPIC
BACTERIA UA: NONE SEEN
BILIRUBIN URINE: NEGATIVE
Glucose, UA: NEGATIVE mg/dL
HGB URINE DIPSTICK: NEGATIVE
KETONES UR: 5 mg/dL — AB
LEUKOCYTES UA: NEGATIVE
NITRITE: NEGATIVE
PH: 5 (ref 5.0–8.0)
Protein, ur: 30 mg/dL — AB
SPECIFIC GRAVITY, URINE: 1.03 (ref 1.005–1.030)
SQUAMOUS EPITHELIAL / LPF: NONE SEEN

## 2017-01-21 LAB — BASIC METABOLIC PANEL
ANION GAP: 6 (ref 5–15)
BUN: 18 mg/dL (ref 6–20)
CALCIUM: 9.3 mg/dL (ref 8.9–10.3)
CO2: 28 mmol/L (ref 22–32)
Chloride: 108 mmol/L (ref 101–111)
Creatinine, Ser: 1.03 mg/dL (ref 0.61–1.24)
GLUCOSE: 130 mg/dL — AB (ref 65–99)
POTASSIUM: 3.8 mmol/L (ref 3.5–5.1)
Sodium: 142 mmol/L (ref 135–145)

## 2017-01-21 LAB — URINE DRUG SCREEN, QUALITATIVE (ARMC ONLY)
Amphetamines, Ur Screen: NOT DETECTED
BARBITURATES, UR SCREEN: NOT DETECTED
BENZODIAZEPINE, UR SCRN: NOT DETECTED
CANNABINOID 50 NG, UR ~~LOC~~: POSITIVE — AB
COCAINE METABOLITE, UR ~~LOC~~: NOT DETECTED
MDMA (Ecstasy)Ur Screen: NOT DETECTED
Methadone Scn, Ur: NOT DETECTED
OPIATE, UR SCREEN: NOT DETECTED
Phencyclidine (PCP) Ur S: NOT DETECTED
Tricyclic, Ur Screen: NOT DETECTED

## 2017-01-21 LAB — ETHANOL: Alcohol, Ethyl (B): <5 mg/dL (ref <5)

## 2017-01-21 MED ORDER — ACETAMINOPHEN 500 MG PO TABS
1000.0000 mg | ORAL_TABLET | Freq: Once | ORAL | Status: AC
  Administered 2017-01-21: 1000 mg via ORAL
  Filled 2017-01-21: qty 2

## 2017-01-21 MED ORDER — SODIUM CHLORIDE 0.9 % IV SOLN
1000.0000 mg | Freq: Once | INTRAVENOUS | Status: AC
  Administered 2017-01-21: 1000 mg via INTRAVENOUS
  Filled 2017-01-21: qty 10

## 2017-01-21 MED ORDER — SODIUM CHLORIDE 0.9 % IV BOLUS (SEPSIS)
1000.0000 mL | Freq: Once | INTRAVENOUS | Status: AC
  Administered 2017-01-21: 1000 mL via INTRAVENOUS

## 2017-01-21 MED ORDER — OXYCODONE HCL 5 MG PO TABS
5.0000 mg | ORAL_TABLET | Freq: Once | ORAL | Status: AC
  Administered 2017-01-21: 5 mg via ORAL
  Filled 2017-01-21: qty 1

## 2017-01-21 NOTE — ED Provider Notes (Signed)
[facility] Emergency Department Provider Note  ____________________________________________  Time seen: Approximately 9:52 AM  I have reviewed the triage vital signs and the nursing notes.   HISTORY  Chief Complaint Ankle Injury and Dizziness   HPI Jeffrey Bryan is a 36 y.o. male with a history of seizure disorder on Keppra who presents for evaluation of syncopal episode and right ankle pain. Patient reports that he was walking outside to smoke when he twisted his ankle. He developed severe pain and swelling located in the right medial aspect of his ankle has been constant and nonradiating. He walked back into his living room. He said he was sitting on the bed when he stood up he felt dizzy and passed out. He did not sustain any injuries. He thinks that he lost consciousness for just a few seconds. When he woke up he was not confused or incontinence. No tongue trauma. He then had a second episode of passing out while standing up from the ground. Again did not hurt himself and again did not seem to have a postictal phase. Patient reports noncompliance with this Keppra for the last 2 weeks. He denies alcohol use. He does smoke marijuana however hasn't done it in the last few weeks. He denies headache, neck pain, tongue trauma, urinary or bowel incontinence, back pain, abdominal pain, nausea or vomiting.  Past Medical History:  Diagnosis Date  . Seizures (HCC)     There are no active problems to display for this patient.   History reviewed. No pertinent surgical history.  Prior to Admission medications   Medication Sig Start Date End Date Taking? Authorizing Provider  levETIRAcetam (KEPPRA) 500 MG tablet Take 1 tablet (500 mg total) by mouth 2 (two) times daily. 08/09/15  Yes Governor Rooks, MD  cyclobenzaprine (FLEXERIL) 5 MG tablet Take 1 tablet (5 mg total) by mouth every 8 (eight) hours as needed for muscle spasms. Patient not taking: Reported on 01/21/2017  02/01/16   Menshew, Charlesetta Ivory, PA-C  hydrocortisone (ANUSOL-HC) 25 MG suppository Place 1 suppository (25 mg total) rectally 2 (two) times daily. Patient not taking: Reported on 01/21/2017 04/30/15   Emily Filbert, MD  nabumetone (RELAFEN) 750 MG tablet Take 1 tablet (750 mg total) by mouth 2 (two) times daily. Patient not taking: Reported on 01/21/2017 02/01/16   Menshew, Charlesetta Ivory, PA-C  naproxen (NAPROSYN) 500 MG tablet Take 1 tablet (500 mg total) by mouth 2 (two) times daily with a meal. Patient not taking: Reported on 01/21/2017 03/09/16   Evangeline Dakin, PA-C    Allergies Tramadol  No family history on file.  Social History Social History  Substance Use Topics  . Smoking status: Current Every Day Smoker    Packs/day: 1.00    Types: Cigarettes  . Smokeless tobacco: Never Used  . Alcohol use Yes     Comment: rarely    Review of Systems  Constitutional: Negative for fever. + syncope Eyes: Negative for visual changes. ENT: Negative for sore throat. Neck: No neck pain  Cardiovascular: Negative for chest pain. Respiratory: Negative for shortness of breath. Gastrointestinal: Negative for abdominal pain, vomiting or diarrhea. Genitourinary: Negative for dysuria. Musculoskeletal: Negative for back pain. + R ankle pain Skin: Negative for rash. Neurological: Negative for headaches, weakness or numbness. Psych: No SI or HI  ____________________________________________   PHYSICAL EXAM:  VITAL SIGNS: ED Triage Vitals  Enc Vitals Group     BP 01/21/17 0928 90/60  Pulse Rate 01/21/17 0928 63     Resp 01/21/17 0928 16     Temp 01/21/17 0928 97.9 F (36.6 C)     Temp Source 01/21/17 0928 Oral     SpO2 01/21/17 0928 99 %     Weight --      Height --      Head Circumference --      Peak Flow --      Pain Score 01/21/17 0926 6     Pain Loc --      Pain Edu? --      Excl. in GC? --     Constitutional: Alert and oriented. Well appearing and in no apparent  distress. HEENT:      Head: Normocephalic and atraumatic.         Eyes: Conjunctivae are normal. Sclera is non-icteric.       Mouth/Throat: Mucous membranes are moist.       Neck: Supple with no signs of meningismus. Cardiovascular: Regular rate and rhythm. No murmurs, gallops, or rubs. 2+ symmetrical distal pulses are present in all extremities. No JVD. Respiratory: Normal respiratory effort. Lungs are clear to auscultation bilaterally. No wheezes, crackles, or rhonchi.  Gastrointestinal: Soft, non tender, and non distended with positive bowel sounds. No rebound or guarding. Musculoskeletal: Swelling and significant ttp over the R lateral malleolus. Nontender with normal range of motion in all other joints. No edema, cyanosis, or erythema of extremities. Neurologic: Normal speech and language. A & O x3, PERRL, no nystagmus, CN II-XII intact, motor testing reveals good tone and bulk throughout. There is no evidence of pronator drift or dysmetria. Muscle strength is 5/5 throughout. Deep tendon reflexes are 2+ throughout with downgoing toes. Sensory examination is intact. Gait is normal. Skin: Skin is warm, dry and intact. No rash noted. Psychiatric: Mood and affect are normal. Speech and behavior are normal.  ____________________________________________   LABS (all labs ordered are listed, but only abnormal results are displayed)  Labs Reviewed  BASIC METABOLIC PANEL - Abnormal; Notable for the following:       Result Value   Glucose, Bld 130 (*)    All other components within normal limits  URINALYSIS, COMPLETE (UACMP) WITH MICROSCOPIC - Abnormal; Notable for the following:    Color, Urine YELLOW (*)    APPearance HAZY (*)    Ketones, ur 5 (*)    Protein, ur 30 (*)    All other components within normal limits  URINE DRUG SCREEN, QUALITATIVE ([facility] ONLY) - Abnormal; Notable for the following:    Cannabinoid 50 Ng, Ur [place] POSITIVE (*)    All other components within normal limits  ETHANOL   CBC WITH DIFFERENTIAL/PLATELET  CBC WITH DIFFERENTIAL/PLATELET   ____________________________________________  EKG  ED ECG REPORT I, Nita Sicklearolina Callin Ashe, the attending physician, personally viewed and interpreted this ECG.   Normal sinus rhythm, rate of 65, normal intervals, normal axis, no ST elevations or depressions.  ____________________________________________  RADIOLOGY  XR ankle:  Marked soft tissue swelling laterally with joint effusion. Suspect ligamentous injury. Old healed fracture medial malleolus. No acute fracture evident. Ankle mortise appears intact. ____________________________________________   PROCEDURES  Procedure(s) performed: None Procedures Critical Care performed:  None ____________________________________________   INITIAL IMPRESSION / ASSESSMENT AND PLAN / ED COURSE   36 y.o. male with a history of seizure disorder on Keppra who presents for evaluation of syncopal episode and right ankle pain. Patient with obvious swelling of the right lateral malleolus. We'll do an x-ray. We'll  treat pain with by mouth Tylenol and oxycodone. Patient has had 2 syncopal episodes which were preceded by feeling dizzy. He did not injure himself. Does not seem to have postictal phase, urinary or bowel incontinence, or tongue trauma therefore low suspicion for seizure however patient has not been taking his Keppra for 2 weeks therefore we'll give him a gram IV. I had a very long discussion with the patient about the dangers of being off of his medication. Patient does have the prescription at home. We'll check EKG and basic blood work to rule out dehydration, cardiac etiology, or electrolyte abnormality versus possible reasons for syncopal events. We'll monitor patient on telemetry.  Clinical Course as of Jan 22 1251  Fri Jan 21, 2017  1247 Labs with no acute findings. Patient remains hemodynamically stable and neurologically intact. He was loaded with Keppra. X-ray showing an  ankle sprain. Patient was placed on a Velcro splint and is going to be discharged with close follow-up with orthopedics. Recommended elevation, ice, NSAIDs and weightbearing as tolerated.  [CV]    Clinical Course User Index [CV] Nita Sickle, MD    Pertinent labs & imaging results that were available during my care of the patient were reviewed by me and considered in my medical decision making (see chart for details).    ____________________________________________   FINAL CLINICAL IMPRESSION(S) / ED DIAGNOSES  Final diagnoses:  Syncope, unspecified syncope type  Sprain of right ankle, unspecified ligament, initial encounter      NEW MEDICATIONS STARTED DURING THIS VISIT:  New Prescriptions   No medications on file     Note:  This document was prepared using Dragon voice recognition software and may include unintentional dictation errors.    Nita Sickle, MD 01/21/17 [phone]

## 2017-01-21 NOTE — ED Triage Notes (Signed)
C/O right ankle injury.  States twisted ankle this morning.

## 2017-01-21 NOTE — ED Triage Notes (Addendum)
Pt has been dizzy since hurting ankle this AM. Pt reports syncope X 2 since incident. NAD currently.  Pt reports he woke up on floor. Was home alone. Only pain is ankle. reports bp runs low

## 2017-01-21 NOTE — ED Notes (Signed)
Pt alert and oriented X4, active, cooperative, pt in NAD. RR even and unlabored, color WNL.  Pt informed to return if any life threatening symptoms occur.   

## 2017-01-21 NOTE — Discharge Instructions (Signed)
Seizures may happen at any time. It is important to take certain precautions to maintain your safety.  ° °Follow up with your doctor in 1-3 days. ° °If you were started on a seizure medication, take it as prescribed. ° °During a seizure, a person may injure himself or herself. Seizure precautions are guidelines that a person can follow in order to minimize injury during a seizure. For any activity, it is important to ask, "What would happen if I had a seizure while doing this?" Follow the below precautions. ° °Bathroom Safety  °A person with seizures may want to shower instead of bathe to avoid accidental drowning. If falls occur during the patient's typical seizure, a person should use a shower seat, preferably one with a safety strap.  °Use nonskid strips in your shower or tub.  °Never use electrical equipment near water. This prevents accidental electrocution.  °Consider changing glass in shower doors to shatterproof glass. ° °Kitchen Safety °If possible, cook when someone else is nearby.  °Use the back burners of the stove to prevent accidental burns.  °Use shatterproof containers as much as possible. For instance, sauces can be transferred from glass bottles to plastic containers for use.  °Limit time that is required using knives or other sharp objects. If possible, buy foods that are already cut, or ask someone to help in meal preparation.  ° °General Safety at Home °Do not smoke or light fires in the fireplace unless someone else is present.  °Do not use space heaters that can be accidentally overturned.  °When alone, avoid using step stools or ladders, and do not clean rooftop gutters.  °Purchase power tools and motorized lawn equipment which have a safety switch that will stop the machine if you release the handle (a 'dead man's' switch).  ° °Driving and Transportation °Avoid driving unless your seizures are well controlled and/or you have permission to drive from your state's Department of Motor Vehicles    °(DMV). Each state has different laws. Please refer to the following link on the [employer]'s website for more information: http://www.epilepsyfoundation.org/answerplace/Social/driving/drivingu.cfm  °If you ride a bicycle, wear a helmet and any other necessary protective gear.  °When taking public transportation like the bus or subway, stay clear of the platform edge.  ° °Outdoor and Sports Safety °Swimming is okay, but does present certain risks. Never swim alone, and tell friends what to do if you have a seizure while swimming.  °Wear appropriate protective equipment.  °Ski with a friend. If a seizure occurs, your friend can seek help, if needed. He or she can also help to get you out of the cold. Consider using a safety hook or belt while riding the ski lift.  ° ° °

## 2019-03-24 ENCOUNTER — Encounter: Payer: Self-pay | Admitting: Emergency Medicine

## 2019-03-24 ENCOUNTER — Other Ambulatory Visit: Payer: Self-pay

## 2019-03-24 ENCOUNTER — Emergency Department
Admission: EM | Admit: 2019-03-24 | Discharge: 2019-03-24 | Disposition: A | Discharge status: Home / Self Care | Payer: Self-pay | Attending: Emergency Medicine | Admitting: Emergency Medicine

## 2019-03-24 DIAGNOSIS — Y999 Unspecified external cause status: Secondary | ICD-10-CM | POA: Insufficient documentation

## 2019-03-24 DIAGNOSIS — X58XXXA Exposure to other specified factors, initial encounter: Secondary | ICD-10-CM | POA: Insufficient documentation

## 2019-03-24 DIAGNOSIS — Y929 Unspecified place or not applicable: Secondary | ICD-10-CM | POA: Insufficient documentation

## 2019-03-24 DIAGNOSIS — Y93H2 Activity, gardening and landscaping: Secondary | ICD-10-CM | POA: Insufficient documentation

## 2019-03-24 DIAGNOSIS — F17210 Nicotine dependence, cigarettes, uncomplicated: Secondary | ICD-10-CM | POA: Insufficient documentation

## 2019-03-24 DIAGNOSIS — S0502XA Injury of conjunctiva and corneal abrasion without foreign body, left eye, initial encounter: Secondary | ICD-10-CM | POA: Insufficient documentation

## 2019-03-24 MED ORDER — KETOROLAC TROMETHAMINE 0.5 % OP SOLN: 1.0000 [drp] | Freq: Four times a day (QID) | OPHTHALMIC | 0 refills | Status: AC

## 2019-03-24 MED ORDER — TETRACAINE HCL 0.5 % OP SOLN
2.0000 [drp] | Freq: Once | OPHTHALMIC | Status: AC
  Administered 2019-03-24: 2 [drp] via OPHTHALMIC
  Filled 2019-03-24: qty 4

## 2019-03-24 MED ORDER — ERYTHROMYCIN 5 MG/GM OP OINT: 1.0000 "application " | TOPICAL_OINTMENT | Freq: Four times a day (QID) | OPHTHALMIC | 0 refills | Status: DC

## 2019-03-24 MED ORDER — FLUORESCEIN SODIUM 1 MG OP STRP
1.0000 | ORAL_STRIP | Freq: Once | OPHTHALMIC | Status: AC
  Administered 2019-03-24: 1 via OPHTHALMIC
  Filled 2019-03-24: qty 1

## 2019-03-24 MED ORDER — ERYTHROMYCIN 5 MG/GM OP OINT
TOPICAL_OINTMENT | Freq: Once | OPHTHALMIC | Status: AC
  Administered 2019-03-24: 1 via OPHTHALMIC
  Filled 2019-03-24: qty 1

## 2019-03-24 NOTE — ED Provider Notes (Signed)
[facility] Emergency Department Provider Note ____________________________________________  Time seen: Approximately 7:59 PM  I have reviewed the triage vital signs and the nursing notes.   HISTORY  Chief Complaint Eye Pain   HPI Jeffrey Bryan is a 38 y.o. male who presents to the emergency department for treatment and evaluation of left eye irritation.  He was mowing the grass at about 10:00 this morning and something got into his left eye.  He has attempted to rinse the eye without any improvement.    Past Medical History:  Diagnosis Date  . Seizures ([place])     There are no active problems to display for this patient.   History reviewed. No pertinent surgical history.  Prior to Admission medications   Medication Sig Start Date End Date Taking? Authorizing Provider  cyclobenzaprine (FLEXERIL) 5 MG tablet Take 1 tablet (5 mg total) by mouth every 8 (eight) hours as needed for muscle spasms. Patient not taking: Reported on 01/21/2017 02/01/16   Menshew, Dannielle Karvonen, PA-C  erythromycin ophthalmic ointment Place 1 application into the left eye 4 (four) times daily. 03/24/19   Kjersten Ormiston, Dessa Phi, FNP  hydrocortisone (ANUSOL-HC) 25 MG suppository Place 1 suppository (25 mg total) rectally 2 (two) times daily. Patient not taking: Reported on 01/21/2017 04/30/15   Earleen Newport, MD  ketorolac (ACULAR) 0.5 % ophthalmic solution Place 1 drop into the left eye 4 (four) times daily for 5 days. 03/24/19 03/29/19  Chinyere Galiano, Johnette Abraham B, FNP  levETIRAcetam (KEPPRA) 500 MG tablet Take 1 tablet (500 mg total) by mouth 2 (two) times daily. 08/09/15   Lisa Roca, MD  nabumetone (RELAFEN) 750 MG tablet Take 1 tablet (750 mg total) by mouth 2 (two) times daily. Patient not taking: Reported on 01/21/2017 02/01/16   Menshew, Dannielle Karvonen, PA-C  naproxen (NAPROSYN) 500 MG tablet Take 1 tablet (500 mg total) by mouth 2 (two) times daily with a meal. Patient not taking: Reported on  01/21/2017 03/09/16   Arlyss Repress, PA-C    Allergies Tramadol  No family history on file.  Social History Social History   Tobacco Use  . Smoking status: Current Every Day Smoker    Packs/day: 1.00    Types: Cigarettes  . Smokeless tobacco: Never Used  Substance Use Topics  . Alcohol use: Yes    Comment: rarely  . Drug use: Yes    Types: Marijuana    Review of Systems   Constitutional: No fever/chills Eyes: Negative for visual changes. Positive for pain. Negative for drainage. Musculoskeletal: Negative for pain. Skin: Negative for rash. Neurological: Negative for headaches, focal weakness or numbness. Allergic: Negative for seasonal allergies. ____________________________________________  PHYSICAL EXAM:  VITAL SIGNS: ED Triage Vitals  Enc Vitals Group     BP 03/24/19 1921 100/81     Pulse Rate 03/24/19 1921 79     Resp 03/24/19 1921 18     Temp 03/24/19 1921 98.2 F (36.8 C)     Temp Source 03/24/19 1921 Oral     SpO2 03/24/19 1921 99 %     Weight 03/24/19 1922 141 lb (64 kg)     Height 03/24/19 1922 5\' 4"  (1.626 m)     Head Circumference --      Peak Flow --      Pain Score 03/24/19 1921 7     Pain Loc --      Pain Edu? --      Excl. in [place]? --  Constitutional: Alert and oriented. Well appearing and in no acute distress. Eyes: Visual acuity--see nursing documentation; no globe trauma; Eyelids normal to inspection; Sclera appears anicteric.  Eyelids were inverted. Conjunctiva appears anecteric; Cornea: small corneal abrasion to the medial aspect over the iris of the left eye at approximately the 9 o'clock position. Head: Atraumatic. Nose: No congestion/rhinnorhea. Mouth/Throat: Mucous membranes are moist.  Oropharynx non-erythematous. Respiratory: Respirations even and unlabored. Breath sounds clear to auscultation. Musculoskeletal:Normal ROM x 4 extremities. Neurologic:  Normal speech and language. No gross focal neurologic deficits are appreciated.  Speech is normal. No gait instability. Skin:  Skin is warm, dry and intact. No rash noted. Psychiatric: Mood and affect are normal. Speech and behavior are normal.  ____________________________________________   LABS (all labs ordered are listed, but only abnormal results are displayed)  Labs Reviewed - No data to display ____________________________________________  EKG  Not indicated ____________________________________________  RADIOLOGY  Not indicated ____________________________________________   PROCEDURES  Procedure(s) performed: None ____________________________________________   INITIAL IMPRESSION / ASSESSMENT AND PLAN / ED COURSE  38 year old male presenting to the ER after something flew into his left eye this morning while mowing grass.  Fluorescein stain exam does reveal a corneal abrasion as described above.  He will be treated with erythromycin ointment and Acular drops.  He was given referral information to see ophthalmology if not improving over the next 48 hours.  He was also advised to return to the emergency department for symptoms change or worsen if he is unable schedule appointment.  Pertinent labs & imaging results that were available during my care of the patient were reviewed by me and considered in my medical decision making (see chart for details). ____________________________________________   FINAL CLINICAL IMPRESSION(S) / ED DIAGNOSES  Final diagnoses:  Abrasion of left cornea, initial encounter    Note:  This document was prepared using Dragon voice recognition software and may include unintentional dictation errors.    Chinita Pesterriplett, Demya Scruggs B, FNP 03/24/19 2026    Arnaldo NatalMalinda, Paul F, MD 03/24/19 2141

## 2019-03-24 NOTE — ED Triage Notes (Addendum)
Patient states that he was mowing about 10:00 this morning and got something in his left eye. Patient states that he continues have pain and irritation in his eye. Patient states that he has tried rinsing his eye with no improvement.

## 2019-03-24 NOTE — Discharge Instructions (Signed)
Please follow up with the eye specialist if not improving over the next 2 days.  Return to the ER for symptoms that change or worsen if unable to schedule an appointment. 

## 2020-03-07 ENCOUNTER — Other Ambulatory Visit: Payer: Self-pay

## 2020-03-07 ENCOUNTER — Ambulatory Visit
Admission: EM | Admit: 2020-03-07 | Discharge: 2020-03-07 | Disposition: A | Discharge status: Home / Self Care | Payer: Self-pay | Attending: Emergency Medicine | Admitting: Emergency Medicine

## 2020-03-07 DIAGNOSIS — M25511 Pain in right shoulder: Secondary | ICD-10-CM

## 2020-03-07 MED ORDER — DEXAMETHASONE SODIUM PHOSPHATE 10 MG/ML IJ SOLN
10.0000 mg | Freq: Once | INTRAMUSCULAR | Status: AC
  Administered 2020-03-07: 15:00:00 10 mg via INTRAMUSCULAR

## 2020-03-07 MED ORDER — IBUPROFEN 800 MG PO TABS: 800.0000 mg | ORAL_TABLET | Freq: Three times a day (TID) | ORAL | 0 refills | Status: DC

## 2020-03-07 MED ORDER — KETOROLAC TROMETHAMINE 30 MG/ML IJ SOLN
30.0000 mg | Freq: Once | INTRAMUSCULAR | Status: AC
  Administered 2020-03-07: 15:00:00 30 mg via INTRAMUSCULAR

## 2020-03-07 NOTE — Discharge Instructions (Addendum)
Toradol and Decadron IM were given in office Ibuprofen 800 mg was prescribed for pain Follow-up and establish care with PCP Follow RICE instructions as attached Return or go to ED for worsening of symptoms

## 2020-03-07 NOTE — ED Triage Notes (Signed)
Patient reports noticing his right shoulder hurting Tuesday after moving it quickly "while dreaming". Requesting medication for pain.

## 2020-03-07 NOTE — ED Provider Notes (Signed)
[facility] CARE CENTER   [id number] 03/07/20 Arrival Time: 1222   Chief Complaint  Patient presents with   Shoulder Pain     SUBJECTIVE: History from: patient.   Jeffrey Bryan is a 39 y.o. male presented to the urgent care for complaint of right shoulder pain for the past 3 days.  Reported developed after moving his right shoulder clinically while sleeping.  He localizes the pain to the right shoulder.  He describes the pain as constant and achy.  He has tried OTC medications without relief.  His symptoms are made worse with ROM.  He denies similar symptoms in the past.  Denies chills, fever, nausea, vomiting, paresthesia, LOC, trauma, injury.   ROS: As per HPI.  All other pertinent ROS negative.     Past Medical History:  Diagnosis Date   Seizures (HCC)    History reviewed. No pertinent surgical history. Allergies  Allergen Reactions   Tramadol    No current facility-administered medications on file prior to encounter.   Current Outpatient Medications on File Prior to Encounter  Medication Sig Dispense Refill   cyclobenzaprine (FLEXERIL) 5 MG tablet Take 1 tablet (5 mg total) by mouth every 8 (eight) hours as needed for muscle spasms. (Patient not taking: Reported on 01/21/2017) 12 tablet 0   erythromycin ophthalmic ointment Place 1 application into the left eye 4 (four) times daily. 3.5 g 0   hydrocortisone (ANUSOL-HC) 25 MG suppository Place 1 suppository (25 mg total) rectally 2 (two) times daily. (Patient not taking: Reported on 01/21/2017) 12 suppository 0   levETIRAcetam (KEPPRA) 500 MG tablet Take 1 tablet (500 mg total) by mouth 2 (two) times daily. 60 tablet 0   nabumetone (RELAFEN) 750 MG tablet Take 1 tablet (750 mg total) by mouth 2 (two) times daily. (Patient not taking: Reported on 01/21/2017) 30 tablet 0   naproxen (NAPROSYN) 500 MG tablet Take 1 tablet (500 mg total) by mouth 2 (two) times daily with a meal. (Patient not taking: Reported on 01/21/2017) 60 tablet  0   Social History   Socioeconomic History   Marital status: Divorced    Spouse name: Not on file   Number of children: Not on file   Years of education: Not on file   Highest education level: Not on file  Occupational History   Not on file  Tobacco Use   Smoking status: Current Every Day Smoker    Packs/day: 1.00    Types: Cigarettes   Smokeless tobacco: Never Used  Substance and Sexual Activity   Alcohol use: Yes    Comment: rarely   Drug use: Yes    Types: Marijuana   Sexual activity: Not on file  Other Topics Concern   Not on file  Social History Narrative   Not on file   Social Determinants of Health   Financial Resource Strain:    Difficulty of Paying Living Expenses:   Food Insecurity:    Worried About Programme researcher, broadcasting/film/video in the Last Year:    Barista in the Last Year:   Transportation Needs:    Freight forwarder (Medical):    Lack of Transportation (Non-Medical):   Physical Activity:    Days of Exercise per Week:    Minutes of Exercise per Session:   Stress:    Feeling of Stress :   Social Connections:    Frequency of Communication with Friends and Family:    Frequency of Social Gatherings with Friends and Family:  Attends Religious Services:    Active Member of Clubs or Organizations:    Attends Engineer, structural:    Marital Status:   Intimate Partner Violence:    Fear of Current or Ex-Partner:    Emotionally Abused:    Physically Abused:    Sexually Abused:    History reviewed. No pertinent family history.  OBJECTIVE:  Vitals:   03/07/20 1328  BP: 112/69  Pulse: 65  Resp: 16  Temp: 98.2 F (36.8 C)  TempSrc: Oral  SpO2: 98%     Physical Exam Vitals and nursing note reviewed.  Constitutional:      General: He is not in acute distress.    Appearance: Normal appearance. He is normal weight. He is not ill-appearing, toxic-appearing or diaphoretic.  Cardiovascular:     Rate and  Rhythm: Normal rate and regular rhythm.     Pulses: Normal pulses.     Heart sounds: Normal heart sounds. No murmur heard.  No friction rub. No gallop.   Pulmonary:     Effort: Pulmonary effort is normal. No respiratory distress.     Breath sounds: Normal breath sounds. No stridor. No wheezing, rhonchi or rales.  Chest:     Chest wall: No tenderness.  Musculoskeletal:     Right shoulder: Tenderness present. No swelling, deformity, effusion, laceration, bony tenderness or crepitus. Normal strength.     Left shoulder: Normal.     Comments: The right shoulder is without any obvious asymmetry or deformity when compared to the left shoulder.  There is no surface trauma, ecchymosis, bruising open wound tissue avulsion.  Limited range of motion due to pain.  Normal muscle strength.  Neurovascular status intact.  Neurological:     Mental Status: He is alert.     LABS:  No results found for this or any previous visit (from the past 24 hour(s)).   ASSESSMENT & PLAN:  1. Acute pain of right shoulder     Meds ordered this encounter  Medications   ibuprofen (ADVIL) 800 MG tablet    Sig: Take 1 tablet (800 mg total) by mouth 3 (three) times daily.    Dispense:  30 tablet    Refill:  0   Patient is stable at discharge.  Symptom is likely most musculature in nature.  Toradol and Decadron IM were given in office.  Ibuprofen was prescribed.  Discharge instructions Toradol and Decadron IM were given in office Ibuprofen 800 mg was prescribed for pain Follow-up and establish care with PCP Follow RICE instructions as attached Return or go to ED for worsening of symptoms   Reviewed expectations re: course of current medical issues. Questions answered. Outlined signs and symptoms indicating need for more acute intervention. Patient verbalized understanding. After Visit Summary given.      Note: This document was prepared using Dragon voice recognition software and may include  unintentional dictation errors.     Durward Parcel, FNP 03/07/20 1432

## 2020-03-08 ENCOUNTER — Other Ambulatory Visit: Payer: Self-pay

## 2020-03-08 ENCOUNTER — Emergency Department (HOSPITAL_COMMUNITY)
Admission: EM | Admit: 2020-03-08 | Discharge: 2020-03-08 | Disposition: A | Discharge status: Home / Self Care | Payer: Self-pay | Attending: Emergency Medicine | Admitting: Emergency Medicine

## 2020-03-08 ENCOUNTER — Emergency Department (HOSPITAL_COMMUNITY): Payer: Self-pay

## 2020-03-08 ENCOUNTER — Encounter (HOSPITAL_COMMUNITY): Payer: Self-pay | Admitting: Emergency Medicine

## 2020-03-08 DIAGNOSIS — F17210 Nicotine dependence, cigarettes, uncomplicated: Secondary | ICD-10-CM | POA: Insufficient documentation

## 2020-03-08 DIAGNOSIS — M62838 Other muscle spasm: Secondary | ICD-10-CM | POA: Insufficient documentation

## 2020-03-08 MED ORDER — CYCLOBENZAPRINE HCL 10 MG PO TABS: 10.0000 mg | ORAL_TABLET | Freq: Two times a day (BID) | ORAL | 0 refills | Status: DC | PRN

## 2020-03-08 NOTE — ED Provider Notes (Signed)
[facility] EMERGENCY DEPARTMENT Provider Note   CSN: [id number] Arrival date & time: 03/08/20  9702     History Chief Complaint  Patient presents with  . Neck Pain    Jeffrey Bryan is a 39 y.o. male with a history of seizure disorder presenting for evaluation of right neck pain that radiates into the right shoulder.  He reports he jerked awake while having a dream Friday nights ago at which time he developed this pain.  He has tried ice and heat, was seen at our urgent care center yesterday and prescribed ibuprofen and given an IM shot of Decadron but reports persistent pain.  He denies weakness or numbness in his upper extremities.  He reports he took 4 Aleve tablets this morning (did not get the ibuprofen filled) without relief of pain.  Pain is worsened with movement.  HPI     Past Medical History:  Diagnosis Date  . Seizures ([facility])     There are no problems to display for this patient.   History reviewed. No pertinent surgical history.     History reviewed. No pertinent family history.  Social History   Tobacco Use  . Smoking status: Current Every Day Smoker    Packs/day: 1.00    Types: Cigarettes  . Smokeless tobacco: Never Used  Vaping Use  . Vaping Use: Never used  Substance Use Topics  . Alcohol use: Yes    Comment: rarely  . Drug use: Yes    Types: Marijuana    Home Medications Prior to Admission medications   Medication Sig Start Date End Date Taking? Authorizing Provider  cyclobenzaprine (FLEXERIL) 10 MG tablet Take 1 tablet (10 mg total) by mouth 2 (two) times daily as needed for muscle spasms. 03/08/20   Burgess Amor, PA-C  erythromycin ophthalmic ointment Place 1 application into the left eye 4 (four) times daily. 03/24/19   Triplett, Kasandra Knudsen, FNP  hydrocortisone (ANUSOL-HC) 25 MG suppository Place 1 suppository (25 mg total) rectally 2 (two) times daily. Patient not taking: Reported on 01/21/2017 04/30/15   Emily Filbert, MD  ibuprofen (ADVIL) 800  MG tablet Take 1 tablet (800 mg total) by mouth 3 (three) times daily. 03/07/20   Avegno, Zachery Dakins, FNP  levETIRAcetam (KEPPRA) 500 MG tablet Take 1 tablet (500 mg total) by mouth 2 (two) times daily. 08/09/15   Governor Rooks, MD  nabumetone (RELAFEN) 750 MG tablet Take 1 tablet (750 mg total) by mouth 2 (two) times daily. Patient not taking: Reported on 01/21/2017 02/01/16   Menshew, Charlesetta Ivory, PA-C  naproxen (NAPROSYN) 500 MG tablet Take 1 tablet (500 mg total) by mouth 2 (two) times daily with a meal. Patient not taking: Reported on 01/21/2017 03/09/16   Evangeline Dakin, PA-C    Allergies    Tramadol  Review of Systems   Review of Systems  Constitutional: Negative for fever.  HENT: Negative for congestion and sore throat.   Eyes: Negative.   Respiratory: Negative for chest tightness and shortness of breath.   Cardiovascular: Negative for chest pain.  Gastrointestinal: Negative for abdominal pain and nausea.  Genitourinary: Negative.   Musculoskeletal: Positive for arthralgias and neck pain. Negative for joint swelling.  Skin: Negative.  Negative for rash and wound.  Neurological: Negative for dizziness, weakness, light-headedness, numbness and headaches.  Psychiatric/Behavioral: Negative.     Physical Exam Updated Vital Signs BP 125/82 (BP Location: Right Arm)   Pulse 77   Temp 98.2 F (36.8 C) (Oral)  Resp 16   Ht 5\' 5"  (1.651 m)   Wt 62.1 kg   SpO2 100%   BMI 22.80 kg/m   Physical Exam Vitals and nursing note reviewed.  Constitutional:      Appearance: He is well-developed.  HENT:     Head: Normocephalic.  Eyes:     Conjunctiva/sclera: Conjunctivae normal.  Cardiovascular:     Rate and Rhythm: Normal rate.     Comments: Pedal pulses normal. Pulmonary:     Effort: Pulmonary effort is normal.  Abdominal:     General: Bowel sounds are normal. There is no distension.     Palpations: Abdomen is soft. There is no mass.  Musculoskeletal:        General: Normal  range of motion.     Cervical back: Normal range of motion and neck supple. Spasms present. No edema, erythema or bony tenderness. Pain with movement present. Normal range of motion.     Thoracic back: Normal.     Lumbar back: No swelling, edema, spasms or tenderness.       Back:     Comments: ttp right trapezius lateral neck to superior shoulder and upper scapula region.  Muscle spasm present.  He has fair ROM of his c spine with worsened pain with rightward rotation.  Skin:    General: Skin is warm and dry.  Neurological:     Mental Status: He is alert.     Sensory: No sensory deficit.     Motor: No tremor or atrophy.     Gait: Gait normal.     Deep Tendon Reflexes:     Reflex Scores:      Bicep reflexes are 2+ on the right side and 2+ on the left side.    Comments: No strength deficit noted in wrist and elbow flexor and extensor muscle groups.  Equal grip strength.     ED Results / Procedures / Treatments   Labs (all labs ordered are listed, but only abnormal results are displayed) Labs Reviewed - No data to display  EKG None  Radiology DG Cervical Spine Complete  Result Date: 03/08/2020 CLINICAL DATA:  Distal neck pain radiating into the right shoulder. EXAM: CERVICAL SPINE - COMPLETE 4+ VIEW COMPARISON:  None. FINDINGS: Minimal reversal of the normal cervical lordosis. Moderate disc space narrowing with mild to moderate anterior posterior spur formation at the C4-5 level. Minimal anterior spur formation at the C5-6 level. Uncinate spurs producing mild bilateral foraminal stenosis at the C4-5 level. 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 Electronically Signed   By: 03/10/2020 M.D.   On: 03/08/2020 10:58    Procedures Procedures (including critical care time)  Medications Ordered in ED Medications - No data to display  ED Course  I have reviewed the triage vital signs and the nursing notes.  Pertinent labs &  imaging results that were available during my care of the patient were reviewed by me and considered in my medical decision making (see chart for details).    MDM Rules/Calculators/A&P                          Imaging reviewed and discussed with pt including the ddd changes noted.  Exam and history most suggestive of trapezius strain/spasm.  He was prescribed flexeril, discussed heat tx and ROM exercises, massage to help resolve this muscle spasm.  No current pcp, referrals given.  No neuro deficits, no emergent surgical findings today. Return precautions were discussed.  Final Clinical Impression(s) / ED Diagnoses Final diagnoses:  Muscle spasms of neck    Rx / DC Orders ED Discharge Orders         Ordered    cyclobenzaprine (FLEXERIL) 10 MG tablet  2 times daily PRN     Discontinue  Reprint     03/08/20 1129           Burgess Amor, PA-C 03/09/20 0756    Eber Hong, MD 03/09/20 [id number]

## 2020-03-08 NOTE — ED Triage Notes (Addendum)
Patient c/o right side neck pain that radiates into right  Shoulder and arm. Per patient started after "jerking out of a dream." Patient states pain x5 days now and unable to sleep. Per patient tried multiple things including medications, ice, and heat with no relief. Patient states last took Aleve x4 this morning at 6:30am. CNS intact. Patient able to make fist.

## 2020-03-08 NOTE — Discharge Instructions (Addendum)
Continue to use your ibuprofen.  Add the muscle relaxer as prescribed.  This may make you drowsy so use caution when taking this medication.  Remember to apply heating pad or gentle hot shower water to these muscles twice daily for 10 minutes to help relax and heal this muscle strain.  Gentle range of motion or massage to the site should gradually improve your sx.  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

## 2020-03-11 ENCOUNTER — Ambulatory Visit (INDEPENDENT_AMBULATORY_CARE_PROVIDER_SITE_OTHER): Payer: Self-pay | Admitting: Family Medicine

## 2020-03-11 ENCOUNTER — Encounter: Payer: Self-pay | Admitting: Family Medicine

## 2020-03-11 ENCOUNTER — Other Ambulatory Visit: Payer: Self-pay

## 2020-03-11 VITALS — BP 126/86 | HR 69 | Temp 97.6°F | Resp 16 | Ht 65.0 in | Wt 132.1 lb

## 2020-03-11 DIAGNOSIS — M25511 Pain in right shoulder: Secondary | ICD-10-CM

## 2020-03-11 DIAGNOSIS — M5412 Radiculopathy, cervical region: Secondary | ICD-10-CM | POA: Insufficient documentation

## 2020-03-11 DIAGNOSIS — F17219 Nicotine dependence, cigarettes, with unspecified nicotine-induced disorders: Secondary | ICD-10-CM | POA: Insufficient documentation

## 2020-03-11 MED ORDER — MELOXICAM 7.5 MG PO TABS: 7.5000 mg | ORAL_TABLET | Freq: Every day | ORAL | 0 refills | Status: DC

## 2020-03-11 MED ORDER — PREDNISONE 10 MG (21) PO TBPK: ORAL_TABLET | ORAL | 0 refills | Status: DC

## 2020-03-11 NOTE — Assessment & Plan Note (Signed)
Asked about quitting: confirms they are currently smokes cigarettes Advise to quit smoking: Educated about QUITTING to reduce the risk of cancer, cardio and cerebrovascular disease. Assess willingness: Unwilling to quit at this time, but is working on cutting back. Assist with counseling and pharmacotherapy: Counseled for 5 minutes and literature provided. Arrange for follow up:  not quitting follow up in 3 months and continue to offer help.   

## 2020-03-11 NOTE — Assessment & Plan Note (Signed)
S&S demonstrate-bursitis vs a mild impingement. Referral to Ortho this week. Only one working in the [employer] work note provided for this wee. RICE measures encouraged Rest, avoiding laying on that side emphasized

## 2020-03-11 NOTE — Patient Instructions (Addendum)
I appreciate the opportunity to provide you with care for your health and wellness. Today we discussed: established care   Follow up: 3 months   No labs   Referrals today: Ortho for shoulder pain   Nice to meet you today.  Please work on smoking less.  Take medications as directed.  Hopefully you will feel better soon. Work note today return on 16th.  Please continue to practice social distancing to keep you, your family, and our community safe.  If you must go out, please wear a mask and practice good handwashing.  It was a pleasure to see you and I look forward to continuing to work together on your health and well-being. Please do not hesitate to call the office if you need care or have questions about your care.  Have a wonderful day and week. With Gratitude, Tereasa Coop, DNP, AGNP-BC

## 2020-03-11 NOTE — Progress Notes (Signed)
Subjective:  Patient ID: ANURAG SCARFO, male    DOB: 20-Feb-1981  Age: 39 y.o. MRN: [id number]  CC:  Chief Complaint  Patient presents with  . New Patient (Initial Visit)    new pt no former pcp has always went to er right shoulder hurting did go to er upset they wouldnt order a MRI has been hurting for about a week has tried ibuprofen flexeril tylenol and aleve       HPI  HPI  Mr Shrewsbury is a 39 year old male patient who presents today to establish care. He comes in today as he has continued right shoulder discomfort for the last 8 days. He reports that he was sound asleep and he was dreaming and then he jerked in his sleep and he thinks he was laying on his arm wrong because ever since then he has had discomfort and pain is just increased over the days. Has presented to the emergency room for this as well. Tried Flexeril without much relief. Has tried ibuprofen, Tylenol, Aleve without much relief. Is willing to try prednisone and Mobic. And refer to Ortho. Work note provided as signs and symptoms possibly consistent with bursitis. Versus impingement.  Has a history of seizures. Reports that he stopped taking his medications 3 years ago. At this time he does not want to follow-up with a neurologist secondary to having limited funds and not having insurance at this.  Today patient denies signs and symptoms of COVID 19 infection including fever, chills, cough, shortness of breath, and headache. Past Medical, Surgical, Social History, Allergies, and Medications have been Reviewed.   Past Medical History:  Diagnosis Date  . Seizures (HCC)     Current Meds  Medication Sig  . cyclobenzaprine (FLEXERIL) 10 MG tablet Take 1 tablet (10 mg total) by mouth 2 (two) times daily as needed for muscle spasms.  Marland Kitchen ibuprofen (ADVIL) 800 MG tablet Take 1 tablet (800 mg total) by mouth 3 (three) times daily.  . [DISCONTINUED] hydrocortisone (ANUSOL-HC) 25 MG suppository Place 1 suppository (25 mg total)  rectally 2 (two) times daily.  . [DISCONTINUED] levETIRAcetam (KEPPRA) 500 MG tablet Take 1 tablet (500 mg total) by mouth 2 (two) times daily.  . [DISCONTINUED] nabumetone (RELAFEN) 750 MG tablet Take 1 tablet (750 mg total) by mouth 2 (two) times daily.  . [DISCONTINUED] naproxen (NAPROSYN) 500 MG tablet Take 1 tablet (500 mg total) by mouth 2 (two) times daily with a meal.    ROS:  Review of Systems  Constitutional: Negative.   HENT: Negative.   Eyes: Negative.   Respiratory: Negative.   Cardiovascular: Negative.   Gastrointestinal: Negative.   Genitourinary: Negative.   Musculoskeletal: Positive for joint pain.  Skin: Negative.   Neurological: Negative.   Endo/Heme/Allergies: Negative.   Psychiatric/Behavioral: Negative.   All other systems reviewed and are negative.    Objective:   Today's Vitals: BP 126/86 (BP Location: Right Arm, Patient Position: Sitting, Cuff Size: Normal)   Pulse 69   Temp 97.6 F (36.4 C) (Temporal)   Resp 16   Ht 5\' 5"  (1.651 m)   Wt 132 lb 1.9 oz (59.9 kg)   SpO2 95%   BMI 21.99 kg/m  Vitals with BMI 03/11/2020 03/08/2020 03/08/2020  Height 5\' 5"  - -  Weight 132 lbs 2 oz - -  BMI 21.99 - -  Systolic 126 125 03/10/2020  Diastolic 86 82 98  Pulse 69 77 75     Physical Exam Vitals and  nursing note reviewed.  Constitutional:      Appearance: Normal appearance. He is well-developed, well-groomed and normal weight.     Comments: Appears in pain, holding right arm straight down but side.  HENT:     Head: Normocephalic and atraumatic.     Right Ear: External ear normal.     Left Ear: External ear normal.     Mouth/Throat:     Comments: Mask in place  Eyes:     General:        Right eye: No discharge.        Left eye: No discharge.     Conjunctiva/sclera: Conjunctivae normal.  Cardiovascular:     Rate and Rhythm: Normal rate and regular rhythm.     Pulses: Normal pulses.     Heart sounds: Normal heart sounds.  Pulmonary:     Effort:  Pulmonary effort is normal.     Breath sounds: Normal breath sounds.  Musculoskeletal:        General: Normal range of motion.     Right shoulder: Bony tenderness present. No swelling. Normal range of motion.     Left shoulder: Normal.     Cervical back: Normal range of motion and neck supple.     Comments: Negative Jobe; Mild + on Hawkins Negative adduction and abduction   Skin:    General: Skin is warm.  Neurological:     General: No focal deficit present.     Mental Status: He is alert and oriented to person, place, and time.  Psychiatric:        Attention and Perception: Attention normal.        Mood and Affect: Mood normal.        Speech: Speech normal.        Behavior: Behavior normal. Behavior is cooperative.        Thought Content: Thought content normal.        Cognition and Memory: Cognition normal.        Judgment: Judgment normal.     Assessment   1. Acute pain of right shoulder   2. Cigarette nicotine dependence with nicotine-induced disorder     Tests ordered No orders of the defined types were placed in this encounter.    Plan: Please see assessment and plan per problem list above.   Meds ordered this encounter  Medications  . meloxicam (MOBIC) 7.5 MG tablet    Sig: Take 1 tablet (7.5 mg total) by mouth daily.    Dispense:  30 tablet    Refill:  0    Order Specific Question:   Supervising Provider    Answer:   SIMPSON, MARGARET E [[id number]]  . predniSONE (STERAPRED UNI-PAK 21 TAB) 10 MG (21) TBPK tablet    Sig: Take as directed    Dispense:  21 tablet    Refill:  0    Order Specific Question:   Supervising Provider    Answer:   Kerri Perches [[id number]]    Patient to follow-up in 3 month   Freddy Finner, NP

## 2020-03-13 ENCOUNTER — Ambulatory Visit (INDEPENDENT_AMBULATORY_CARE_PROVIDER_SITE_OTHER): Payer: Self-pay | Admitting: Orthopaedic Surgery

## 2020-03-13 ENCOUNTER — Encounter: Payer: Self-pay | Admitting: Orthopaedic Surgery

## 2020-03-13 ENCOUNTER — Other Ambulatory Visit: Payer: Self-pay

## 2020-03-13 VITALS — BP 124/85 | HR 85 | Ht 65.0 in | Wt 133.0 lb

## 2020-03-13 DIAGNOSIS — M542 Cervicalgia: Secondary | ICD-10-CM

## 2020-03-13 NOTE — Progress Notes (Signed)
Subjective:    Patient ID: Jeffrey Bryan, male    DOB: [birth year], 39 y.o.   MRN: [id number]  HPI He has had pain from the neck to the shoulder to the hand for over a week and a half.  He has had some numbness.  He went to the Urgent Care and a few days after that to the ER at [facility] on the 18th.  He had x-rays of the neck.  It showed:  IMPRESSION: Degenerative changes at the C4-5 level and to a lesser degree at the C5-6 level.  I have independently reviewed and interpreted x-rays of this patient done at another site by another physician or qualified health professional.  He had continued pain.  He was given pain medicine and Flexeril.  He went to his primary care on 7-20.  He was given prednisone pack and Mobic.  He is better with the prednisone.  He has less pain and no numbness today.  He has no trauma.   Review of Systems  Constitutional: Positive for activity change.  Musculoskeletal: Positive for arthralgias and neck pain.  All other systems reviewed and are negative.  For Review of Systems, all other systems reviewed and are negative.  The following is a summary of the past history medically, past history surgically, known current medicines, social history and family history.  This information is gathered electronically by the computer from prior information and documentation.  I review this each visit and have found including this information at this point in the chart is beneficial and informative.   Past Medical History:  Diagnosis Date  . Seizures (HCC)     History reviewed. No pertinent surgical history.  Current Outpatient Medications on File Prior to Visit  Medication Sig Dispense Refill  . predniSONE (STERAPRED UNI-PAK 21 TAB) 10 MG (21) TBPK tablet Take as directed 21 tablet 0  . cyclobenzaprine (FLEXERIL) 10 MG tablet Take 1 tablet (10 mg total) by mouth 2 (two) times daily as needed for muscle spasms. (Patient not taking: Reported on 03/13/2020) 20 tablet 0    . ibuprofen (ADVIL) 800 MG tablet Take 1 tablet (800 mg total) by mouth 3 (three) times daily. (Patient not taking: Reported on 03/13/2020) 30 tablet 0  . meloxicam (MOBIC) 7.5 MG tablet Take 1 tablet (7.5 mg total) by mouth daily. (Patient not taking: Reported on 03/13/2020) 30 tablet 0   No current facility-administered medications on file prior to visit.    Social History   Socioeconomic History  . Marital status: Divorced    Spouse name: Not on file  . Number of children: 1  . Years of education: Not on file  . Highest education level: 10th grade  Occupational History  . Not on file  Tobacco Use  . Smoking status: Current Every Day Smoker    Packs/day: 0.50    Types: Cigarettes  . Smokeless tobacco: Never Used  Vaping Use  . Vaping Use: Never used  Substance and Sexual Activity  . Alcohol use: Yes    Comment: rarely  . Drug use: Yes    Types: Marijuana  . Sexual activity: Yes  Other Topics Concern  . Not on file  Social History Narrative   Lives with finance and son -37 year old      Enjoy: spending tie with family and friends       Diet: eats all food groups   Caffeine: tea    Water: 1-2 cups daily  Wears seat belt    Does not use phone while driving.   Smoke detectors at home   No weapons at home   Social Determinants of Health   Financial Resource Strain: Low Risk   . Difficulty of Paying Living Expenses: Not hard at all  Food Insecurity: No Food Insecurity  . Worried About Programme researcher, broadcasting/film/video in the Last Year: Never true  . Ran Out of Food in the Last Year: Never true  Transportation Needs: No Transportation Needs  . Lack of Transportation (Medical): No  . Lack of Transportation (Non-Medical): No  Physical Activity: Insufficiently Active  . Days of Exercise per Week: 2 days  . Minutes of Exercise per Session: 30 min  Stress: No Stress Concern Present  . Feeling of Stress : Not at all  Social Connections: Moderately Isolated  . Frequency of  Communication with Friends and Family: More than three times a week  . Frequency of Social Gatherings with Friends and Family: More than three times a week  . Attends Religious Services: Never  . Active Member of Clubs or Organizations: No  . Attends Banker Meetings: Never  . Marital Status: Living with partner  Intimate Partner Violence: Not At Risk  . Fear of Current or Ex-Partner: No  . Emotionally Abused: No  . Physically Abused: No  . Sexually Abused: No    Family History  Problem Relation Age of Onset  . Healthy Mother   . Cancer Father        brain cancer    BP 124/85   Pulse 85   Ht 5\' 5"  (1.651 m)   Wt 133 lb (60.3 kg)   BMI 22.13 kg/m   Body mass index is 22.13 kg/m.     Objective:   Physical Exam Vitals and nursing note reviewed.  Constitutional:      Appearance: He is well-developed.  HENT:     Head: Normocephalic and atraumatic.  Eyes:     Conjunctiva/sclera: Conjunctivae normal.     Pupils: Pupils are equal, round, and reactive to light.  Cardiovascular:     Rate and Rhythm: Normal rate and regular rhythm.  Pulmonary:     Effort: Pulmonary effort is normal.  Abdominal:     Palpations: Abdomen is soft.  Musculoskeletal:       Arms:     Cervical back: Normal range of motion and neck supple.  Skin:    General: Skin is warm and dry.  Neurological:     Mental Status: He is alert and oriented to person, place, and time.     Cranial Nerves: No cranial nerve deficit.     Motor: No abnormal muscle tone.     Coordination: Coordination normal.     Deep Tendon Reflexes: Reflexes are normal and symmetric. Reflexes normal.  Psychiatric:        Behavior: Behavior normal.        Thought Content: Thought content normal.        Judgment: Judgment normal.           Assessment & Plan:   Encounter Diagnosis  Name Primary?  . Cervical pain Yes   I am concerned about C6 and C7 nerve root irritation.  Finish up the prednisone  pack.  Return in one week.  He may need MRI of the neck.  Call if any problem.  Precautions discussed.   Electronically Signed , MD 7/22/20218:18 AM

## 2020-03-20 ENCOUNTER — Encounter: Payer: Self-pay | Admitting: Orthopaedic Surgery

## 2020-03-20 ENCOUNTER — Ambulatory Visit (INDEPENDENT_AMBULATORY_CARE_PROVIDER_SITE_OTHER): Payer: Self-pay | Admitting: Orthopaedic Surgery

## 2020-03-20 ENCOUNTER — Other Ambulatory Visit: Payer: Self-pay

## 2020-03-20 VITALS — BP 120/79 | HR 90 | Ht 65.0 in | Wt 133.0 lb

## 2020-03-20 DIAGNOSIS — M542 Cervicalgia: Secondary | ICD-10-CM

## 2020-03-20 NOTE — Progress Notes (Signed)
Patient XB:MWUXL CLEDITH ABDOU, male DOB:1981/05/25, 39 y.o. KGM:[id number]  Chief Complaint  Patient presents with  . Neck Pain    HPI  Jeffrey Bryan is a 39 y.o. male who has continued pain from the neck to the right shoulder, down the arm to the hand and numbness of the index finger dorsally.  He took the prednisone pack and it did not help that much.  He is in pain.  I will get MRI of the cervical spine.   Body mass index is 22.13 kg/m.  ROS  Review of Systems  Constitutional: Positive for activity change.  Musculoskeletal: Positive for arthralgias and neck pain.  All other systems reviewed and are negative.   All other systems reviewed and are negative.  The following is a summary of the past history medically, past history surgically, known current medicines, social history and family history.  This information is gathered electronically by the computer from prior information and documentation.  I review this each visit and have found including this information at this point in the chart is beneficial and informative.    Past Medical History:  Diagnosis Date  . Seizures (HCC)     History reviewed. No pertinent surgical history.  Family History  Problem Relation Age of Onset  . Healthy Mother   . Cancer Father        brain cancer    Social History Social History   Tobacco Use  . Smoking status: Current Every Day Smoker    Packs/day: 0.50    Types: Cigarettes  . Smokeless tobacco: Never Used  Vaping Use  . Vaping Use: Never used  Substance Use Topics  . Alcohol use: Yes    Comment: rarely  . Drug use: Yes    Types: Marijuana    Allergies  Allergen Reactions  . Tramadol     Current Outpatient Medications  Medication Sig Dispense Refill  . cyclobenzaprine (FLEXERIL) 10 MG tablet Take 1 tablet (10 mg total) by mouth 2 (two) times daily as needed for muscle spasms. 20 tablet 0  . meloxicam (MOBIC) 7.5 MG tablet Take 1 tablet (7.5 mg total) by mouth daily. 30  tablet 0   No current facility-administered medications for this visit.     Physical Exam  Blood pressure 120/79, pulse 90, height 5\' 5"  (1.651 m), weight 133 lb (60.3 kg).  Constitutional: overall normal hygiene, normal nutrition, well developed, normal grooming, normal body habitus. Assistive device:none  Musculoskeletal: gait and station Limp none, muscle tone and strength are normal, no tremors or atrophy is present.  .  Neurological: coordination overall normal.  Deep tendon reflex/nerve stretch intact.  Sensation normal.  Cranial nerves II-XII intact.   Skin:   Normal overall no scars, lesions, ulcers or rashes. No psoriasis.  Psychiatric: Alert and oriented x 3.  Recent memory intact, remote memory unclear.  Normal mood and affect. Well groomed.  Good eye contact.  Cardiovascular: overall no swelling, no varicosities, no edema bilaterally, normal temperatures of the legs and arms, no clubbing, cyanosis and good capillary refill.  Lymphatic: palpation is normal.  Neck has full ROM.  Triceps reflex weak on the right and strength 4 of 5 for right dominant arm.  All other systems reviewed and are negative   The patient has been educated about the nature of the problem(s) and counseled on treatment options.  The patient appeared to understand what I have discussed and is in agreement with it.  Encounter Diagnosis  Name Primary?   Cervical pain Yes    PLAN Call if any problems.  Precautions discussed.  Continue current medications.   Return to clinic 2 weeks   Get MRI neck.  Rule out HNP.  Electronically Signed Darreld Mclean, MD 7/29/20218:20 AM

## 2020-03-20 NOTE — Patient Instructions (Addendum)
MRI scan has been ordered for you, we will get approval for the scan if needed, then [facility] will call you with appointment. After you get the appointment for the MRI, please call us to schedule appointment for a follow up.  You can call them also, will be quicker [phone]   Steps to Quit Smoking Smoking tobacco is the leading cause of preventable death. It can affect almost every organ in the body. Smoking puts you and people around you at risk for many serious, long-lasting (chronic) diseases. Quitting smoking can be hard, but it is one of the best things that you can do for your health. It is never too late to quit. How do I get ready to quit? When you decide to quit smoking, make a plan to help you succeed. Before you quit:  Pick a date to quit. Set a date within the next 2 weeks to give you time to prepare.  Write down the reasons why you are quitting. Keep this list in places where you will see it often.  Tell your family, friends, and co-workers that you are quitting. Their support is important.  Talk with your doctor about the choices that may help you quit.  Find out if your health insurance will pay for these treatments.  Know the people, places, things, and activities that make you want to smoke (triggers). Avoid them. What first steps can I take to quit smoking?  Throw away all cigarettes at home, at work, and in your car.  Throw away the things that you use when you smoke, such as ashtrays and lighters.  Clean your car. Make sure to empty the ashtray.  Clean your home, including curtains and carpets. What can I do to help me quit smoking? Talk with your doctor about taking medicines and seeing a counselor at the same time. You are more likely to succeed when you do both.  If you are pregnant or breastfeeding, talk with your doctor about counseling or other ways to quit smoking. Do not take medicine to help you quit smoking unless your doctor tells you to do so. To  quit smoking: Quit right away  Quit smoking totally, instead of slowly cutting back on how much you smoke over a period of time.  Go to counseling. You are more likely to quit if you go to counseling sessions regularly. Take medicine You may take medicines to help you quit. Some medicines need a prescription, and some you can buy over-the-counter. Some medicines may contain a drug called nicotine to replace the nicotine in cigarettes. Medicines may:  Help you to stop having the desire to smoke (cravings).  Help to stop the problems that come when you stop smoking (withdrawal symptoms). Your doctor may ask you to use:  Nicotine patches, gum, or lozenges.  Nicotine inhalers or sprays.  Non-nicotine medicine that is taken by mouth. Find resources Find resources and other ways to help you quit smoking and remain smoke-free after you quit. These resources are most helpful when you use them often. They include:  Online chats with a Veterinary surgeon.  Phone quitlines.  Printed Materials engineer.  Support groups or group counseling.  Text messaging programs.  Mobile phone apps. Use apps on your mobile phone or tablet that can help you stick to your quit plan. There are many free apps for mobile phones and tablets as well as websites. Examples include Quit Guide from the [employer] and smokefree.gov  What things can I do  to make it easier to quit?   Talk to your family and friends. Ask them to support and encourage you.  Call a phone quitline (1-800-QUIT-NOW), reach out to support groups, or work with a Veterinary surgeon.  Ask people who smoke to not smoke around you.  Avoid places that make you want to smoke, such as: ? Bars. ? Parties. ? Smoke-break areas at work.  Spend time with people who do not smoke.  Lower the stress in your life. Stress can make you want to smoke. Try these things to help your stress: ? Getting regular exercise. ? Doing deep-breathing exercises. ? Doing  yoga. ? Meditating. ? Doing a body scan. To do this, close your eyes, focus on one area of your body at a time from head to toe. Notice which parts of your body are tense. Try to relax the muscles in those areas. How will I feel when I quit smoking? Day 1 to 3 weeks Within the first 24 hours, you may start to have some problems that come from quitting tobacco. These problems are very bad 2-3 days after you quit, but they do not often last for more than 2-3 weeks. You may get these symptoms:  Mood swings.  Feeling restless, nervous, angry, or annoyed.  Trouble concentrating.  Dizziness.  Strong desire for high-sugar foods and nicotine.  Weight gain.  Trouble pooping (constipation).  Feeling like you may vomit (nausea).  Coughing or a sore throat.  Changes in how the medicines that you take for other issues work in your body.  Depression.  Trouble sleeping (insomnia). Week 3 and afterward After the first 2-3 weeks of quitting, you may start to notice more positive results, such as:  Better sense of smell and taste.  Less coughing and sore throat.  Slower heart rate.  Lower blood pressure.  Clearer skin.  Better breathing.  Fewer sick days. Quitting smoking can be hard. Do not give up if you fail the first time. Some people need to try a few times before they succeed. Do your best to stick to your quit plan, and talk with your doctor if you have any questions or concerns. Summary  Smoking tobacco is the leading cause of preventable death. Quitting smoking can be hard, but it is one of the best things that you can do for your health.  When you decide to quit smoking, make a plan to help you succeed.  Quit smoking right away, not slowly over a period of time.  When you start quitting, seek help from your doctor, family, or friends. This information is not intended to replace advice given to you by your health care provider. Make sure you discuss any questions you  have with your health care provider. Document Revised: 05/04/2019 Document Reviewed: 10/28/2018 Elsevier Patient Education  2020 [employer].

## 2020-04-08 ENCOUNTER — Ambulatory Visit (HOSPITAL_COMMUNITY)
Admission: RE | Admit: 2020-04-08 | Discharge: 2020-04-08 | Disposition: A | Discharge status: Home / Self Care | Payer: Self-pay | Source: Ambulatory Visit | Attending: Orthopaedic Surgery | Admitting: Orthopaedic Surgery

## 2020-04-08 ENCOUNTER — Other Ambulatory Visit: Payer: Self-pay

## 2020-04-08 DIAGNOSIS — M542 Cervicalgia: Secondary | ICD-10-CM | POA: Insufficient documentation

## 2020-04-10 ENCOUNTER — Encounter: Payer: Self-pay | Admitting: Orthopaedic Surgery

## 2020-04-10 ENCOUNTER — Other Ambulatory Visit: Payer: Self-pay

## 2020-04-10 ENCOUNTER — Ambulatory Visit (INDEPENDENT_AMBULATORY_CARE_PROVIDER_SITE_OTHER): Payer: Self-pay | Admitting: Orthopaedic Surgery

## 2020-04-10 VITALS — BP 122/81 | HR 84 | Ht 65.0 in | Wt 130.0 lb

## 2020-04-10 DIAGNOSIS — M542 Cervicalgia: Secondary | ICD-10-CM

## 2020-04-10 MED ORDER — PREDNISONE 5 MG (21) PO TBPK: ORAL_TABLET | ORAL | 0 refills | Status: DC

## 2020-04-10 NOTE — Progress Notes (Signed)
Patient Jeffrey Bryan, male DOB:February 11, 1981, 39 y.o. XBM:[id number]  Chief Complaint  Patient presents with  . Neck Pain    HPI  Jeffrey Bryan is a 39 y.o. male who has neck pain and right sided paresthesias and weakness.  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  Mild right C3-4 and bilateral C4-6 neural foraminal narrowing.  Mild C4-6 spinal canal narrowing.  I have explained the findings to him.  Since he has paresthesias, I will have him see Dr. Alvester Morin and see if he is an appropriate patient for injection.   Body mass index is 21.63 kg/m.  ROS  Review of Systems  Constitutional: Positive for activity change.  Musculoskeletal: Positive for arthralgias and neck pain.  All other systems reviewed and are negative.   All other systems reviewed and are negative.  The following is a summary of the past history medically, past history surgically, known current medicines, social history and family history.  This information is gathered electronically by the computer from prior information and documentation.  I review this each visit and have found including this information at this point in the chart is beneficial and informative.    Past Medical History:  Diagnosis Date  . Seizures (HCC)     History reviewed. No pertinent surgical history.  Family History  Problem Relation Age of Onset  . Healthy Mother   . Cancer Father        brain cancer    Social History Social History   Tobacco Use  . Smoking status: Current Every Day Smoker    Packs/day: 0.50    Types: Cigarettes  . Smokeless tobacco: Never Used  Vaping Use  . Vaping Use: Never used  Substance Use Topics  . Alcohol use: Yes    Comment: rarely  . Drug use: Yes    Types: Marijuana    Allergies  Allergen Reactions  . Tramadol     Current Outpatient Medications  Medication Sig Dispense Refill  . cyclobenzaprine (FLEXERIL) 10  MG tablet Take 1 tablet (10 mg total) by mouth 2 (two) times daily as needed for muscle spasms. 20 tablet 0  . meloxicam (MOBIC) 7.5 MG tablet Take 1 tablet (7.5 mg total) by mouth daily. 30 tablet 0   No current facility-administered medications for this visit.     Physical Exam  Blood pressure 122/81, pulse 84, height 5\' 5"  (1.651 m), weight 130 lb (59 kg).  Constitutional: overall normal hygiene, normal nutrition, well developed, normal grooming, normal body habitus. Assistive device:none  Musculoskeletal: gait and station Limp none, muscle tone and strength are normal, no tremors or atrophy is present.  .  Neurological: coordination overall normal.  Deep tendon reflex/nerve stretch intact.  Sensation normal.  Cranial nerves II-XII intact.   Skin:   Normal overall no scars, lesions, ulcers or rashes. No psoriasis.  Psychiatric: Alert and oriented x 3.  Recent memory intact, remote memory unclear.  Normal mood and affect. Well groomed.  Good eye contact.  Cardiovascular: overall no swelling, no varicosities, no edema bilaterally, normal temperatures of the legs and arms, no clubbing, cyanosis and good capillary refill.  Lymphatic: palpation is normal.  All other systems reviewed and are negative   The patient has been educated about the nature of the problem(s) and counseled on treatment options.  The patient appeared to understand what I have discussed and is in agreement with it.  Encounter Diagnosis  Name Primary?   Cervical pain Yes    PLAN Call if any problems.  Precautions discussed.  Continue current medications.   Return to clinic to see Dr. Alvester Morin    Electronically Signed Darreld Mclean, MD 8/19/202111:21 AM

## 2020-05-06 ENCOUNTER — Ambulatory Visit (INDEPENDENT_AMBULATORY_CARE_PROVIDER_SITE_OTHER): Payer: Self-pay | Admitting: Physical Medicine and Rehabilitation

## 2020-05-06 ENCOUNTER — Other Ambulatory Visit: Payer: Self-pay

## 2020-05-06 ENCOUNTER — Encounter: Payer: Self-pay | Admitting: Physical Medicine and Rehabilitation

## 2020-05-06 VITALS — BP 112/81 | HR 84

## 2020-05-06 DIAGNOSIS — G8929 Other chronic pain: Secondary | ICD-10-CM

## 2020-05-06 DIAGNOSIS — M25511 Pain in right shoulder: Secondary | ICD-10-CM

## 2020-05-06 DIAGNOSIS — M5412 Radiculopathy, cervical region: Secondary | ICD-10-CM

## 2020-05-06 DIAGNOSIS — M5010 Cervical disc disorder with radiculopathy, unspecified cervical region: Secondary | ICD-10-CM

## 2020-05-06 DIAGNOSIS — F411 Generalized anxiety disorder: Secondary | ICD-10-CM

## 2020-05-06 MED ORDER — DIAZEPAM 5 MG PO TABS: ORAL_TABLET | ORAL | 0 refills | Status: DC

## 2020-05-06 NOTE — Progress Notes (Signed)
Right sided neck and shoulder pain. Sometimes has pain down to right elbow. No numbness or tingling. Numeric Pain Rating Scale and Functional Assessment Average Pain 8 Pain Right Now 5 My pain is intermittent, sharp and stabbing Pain is worse with: some activites Pain improves with: heat/ice   In the last MONTH (on 0-10 scale) has pain interfered with the following?  1. General activity like being  able to carry out your everyday physical activities such as walking, climbing stairs, carrying groceries, or moving a chair?  Rating(7)  2. Relation with others like being able to carry out your usual social activities and roles such as  activities at home, at work and in your community. Rating(7)  3. Enjoyment of life such that you have  been bothered by emotional problems such as feeling anxious, depressed or irritable?  Rating(9)

## 2020-05-06 NOTE — Progress Notes (Signed)
Jeffrey Bryan - 39 y.o. male MRN [id number]  Date of birth: 1981/01/18  Office Visit Note: Visit Date: 05/06/2020 PCP: Freddy Finner, NP Referred by: Freddy Finner, NP  Subjective: Chief Complaint  Patient presents with  . Neck - Pain   HPI: Jeffrey Bryan is a 39 y.o. male who comes in today At the request of Dr. Darreld Mclean for evaluation management of 2 months of chronic severe neck and right shoulder pain.  He has had a history of intermittent neck and shoulder pain in the past but in the early part of July he had abrupt onset after awakening with right neck and trapezial pain into the shoulder.  He initially was seen in urgent care and given a Toradol injection as well as ibuprofen.  He ended up not taking ibuprofen but did take some Aleve at home and he just was having a lot of symptoms and return to the emergency department.  At that time he was given Flexeril which he has had in the past.  From there he was seen by his primary care provider Tereasa Coop, FNP who gave him a prednisone taper as well as meloxicam and did send him to Dr. Hilda Lias.  With Dr. Hilda Lias he was also having some radicular type paresthesia into the first and second digit more of a C6 or possibly C7 distribution.  His main symptoms are still in his neck and shoulder but the paresthesias really have not happened.  He has had one episode of sharp pain.  No history of prior cervical surgery no specific trauma.  Dr. Hilda Lias did obtain MRI of the cervical spine.  This is reviewed with the patient and reviewed below.  Findings shows congenital stenosis with mild reversal of lordosis at see 4-5 as well as right paracentral disc at C5-6.  He has not noted any focal weakness or balance difficulties or fevers chills or night sweats or any other red flag complaints.  Review of Systems  Musculoskeletal: Positive for joint pain and neck pain.  All other systems reviewed and are negative.  Otherwise per HPI.  Assessment &  Plan: Visit Diagnoses:  1. Cervical radiculopathy   2. Cervical disc disorder with radiculopathy   3. Chronic right shoulder pain   4. Pre-operative anxiety     Plan: Findings:  Chronic 38-month history of worsening neck and posterior trapezial and shoulder pain with at least some time history of a referral into the hand and more of a C6 or C7 distribution versus carpal tunnel type syndrome.  Exam today not really significant for carpal tunnel complaints.  His history is complicated by seizure disorder as well as smoking history.  MRI significant for particular on the right at C5-6 but also some canal narrowing at C4-5.  At this point we will release treatment options and we did decide to complete diagnostic and hopefully therapeutic cervical epidural injection.  This is done at C7-T1 from a safety perspective using fluoroscopic guidance.  We will use some preprocedure Valium because patient is somewhat anxious about getting an injection injection.  His exam was consistent with myofascial pain syndrome as well there was a pretty significant trigger point taut man in the right supraspinatus versus levator scapular area.  Further treatment option may be trigger point injection.  He will stay on current medications    Meds & Orders:  Meds ordered this encounter  Medications  . diazepam (VALIUM) 5 MG tablet    Sig: Take 1  by mouth 1 hour  pre-procedure with very light food. May bring 2nd tablet to appointment.    Dispense:  2 tablet    Refill:  0   No orders of the defined types were placed in this encounter.   Follow-up: Return for Right C7-T1 interlaminar dural steroid injection.   Procedures: No procedures performed  No notes on file   Clinical History: Cervical radiculopathy.  EXAM: MRI CERVICAL SPINE WITHOUT CONTRAST  TECHNIQUE: Multiplanar, multisequence MR imaging of the cervical spine was performed. No intravenous contrast was administered.  COMPARISON:  03/08/2020 cervical  spine radiographs.  FINDINGS: Alignment: Straightening of cervical lordosis.  Vertebrae: Normal bone marrow signal intensity. No focal osseous lesion.  Cord: Normal signal and morphology.  Posterior Fossa, vertebral arteries: Negative.  Disc levels: Multilevel desiccation with mild disc space loss. Congenital spinal canal narrowing.  C2-3: Patent spinal canal and bilateral neural foramina.  C3-4: Disc osteophyte complex with superimposed small central protrusion and uncovertebral hypertrophy. Patent spinal canal and left neural foramen. Mild right neural foraminal narrowing.  C4-5: Disc osteophyte complex abutting the ventral cord with uncovertebral and bilateral facet degenerative spurring. Small superimposed left subarticular protrusion. Mild spinal canal and bilateral neural foraminal narrowing.  C5-6: Disc osteophyte complex with superimposed central protrusion and uncovertebral degenerative spurring. Mild spinal canal and bilateral neural foraminal narrowing.  C6-7: Patent spinal canal and bilateral neural foramen.  C7-T1: Bilateral uncovertebral degenerative spurring. Patent spinal canal and bilateral neural foramen.  Paraspinal tissues: Within normal limits.  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  Mild right C3-4 and bilateral C4-6 neural foraminal narrowing.  Mild C4-6 spinal canal narrowing.   Electronically Signed   By: Stana Bunting M.D.   On: 04/08/2020 10:16   He reports that he has been smoking cigarettes. He has been smoking about 0.50 packs per day. He has never used smokeless tobacco. No results for input(s): HGBA1C, LABURIC in the last 8760 hours.  Objective:  VS:  HT:    WT:   BMI:     BP:112/81  HR:84bpm  TEMP: ( )  RESP:  Physical Exam Vitals and nursing note reviewed.  Constitutional:      General: He is not in acute distress.    Appearance: Normal appearance. He is not  ill-appearing.  HENT:     Head: Normocephalic and atraumatic.     Right Ear: External ear normal.     Left Ear: External ear normal.  Eyes:     Extraocular Movements: Extraocular movements intact.  Cardiovascular:     Rate and Rhythm: Normal rate.     Pulses: Normal pulses.  Abdominal:     General: There is no distension.     Palpations: Abdomen is soft.  Musculoskeletal:        General: No signs of injury.     Cervical back: Neck supple. Tenderness present. No rigidity.     Right lower leg: No edema.     Left lower leg: No edema.     Comments: Patient has good strength in the upper extremities with 5 out of 5 strength in wrist extension long finger flexion APB.  No intrinsic hand muscle atrophy.  Negative Hoffmann's test.  Patient has somewhat limited range of motion with right rotation compared to left.  He has a negative Hoffmann's test.  He does have trigger point particularly in the right levator scapula supraspinatus area this does reproduce some of his pain.  He does not have much in the  way of shoulder impingement.  Lymphadenopathy:     Cervical: No cervical adenopathy.  Skin:    Findings: No erythema or rash.  Neurological:     General: No focal deficit present.     Mental Status: He is alert and oriented to person, place, and time.     Sensory: No sensory deficit.     Motor: No weakness or abnormal muscle tone.     Coordination: Coordination normal.  Psychiatric:        Mood and Affect: Mood normal.        Behavior: Behavior normal.     Ortho Exam  Imaging: No results found.  Past Medical/Family/Surgical/Social History: Medications & Allergies reviewed per EMR, new medications updated. Patient Active Problem List   Diagnosis Date Noted  . Acute pain of right shoulder 03/11/2020  . Cigarette nicotine dependence with nicotine-induced disorder 03/11/2020   Past Medical History:  Diagnosis Date  . Seizures (HCC)    Family History  Problem Relation Age of Onset   . Healthy Mother   . Cancer Father        brain cancer   History reviewed. No pertinent surgical history. Social History   Occupational History  . Not on file  Tobacco Use  . Smoking status: Current Every Day Smoker    Packs/day: 0.50    Types: Cigarettes  . Smokeless tobacco: Never Used  Vaping Use  . Vaping Use: Never used  Substance and Sexual Activity  . Alcohol use: Yes    Comment: rarely  . Drug use: Yes    Types: Marijuana  . Sexual activity: Yes

## 2020-05-08 ENCOUNTER — Other Ambulatory Visit: Payer: Self-pay

## 2020-05-08 ENCOUNTER — Encounter: Payer: Self-pay | Admitting: Physical Medicine and Rehabilitation

## 2020-05-08 ENCOUNTER — Ambulatory Visit (INDEPENDENT_AMBULATORY_CARE_PROVIDER_SITE_OTHER): Payer: Self-pay | Admitting: Physical Medicine and Rehabilitation

## 2020-05-08 ENCOUNTER — Ambulatory Visit: Payer: Self-pay

## 2020-05-08 VITALS — BP 109/73 | HR 72

## 2020-05-08 DIAGNOSIS — M5010 Cervical disc disorder with radiculopathy, unspecified cervical region: Secondary | ICD-10-CM

## 2020-05-08 MED ORDER — METHYLPREDNISOLONE ACETATE 80 MG/ML IJ SUSP
80.0000 mg | Freq: Once | INTRAMUSCULAR | Status: AC
  Administered 2020-05-08: 11:00:00 80 mg

## 2020-05-08 NOTE — Progress Notes (Signed)
Pt state neck pain that travels down to his right elbow. Pt state Laying down on his right side makes his pain worse. Pt state if he does to do much during the day he has a lot of pain. Pt state pain meds helps ease the pain.  Numeric Pain Rating Scale and Functional Assessment Average Pain 5   In the last MONTH (on 0-10 scale) has pain interfered with the following?  1. General activity like being  able to carry out your everyday physical activities such as walking, climbing stairs, carrying groceries, or moving a chair?  Rating(8)   +Driver, -BT, -Dye Allergies.

## 2020-05-16 NOTE — Progress Notes (Signed)
Jeffrey Bryan - 39 y.o. male MRN [id number]  Date of birth: 03/29/1981  Office Visit Note: Visit Date: 05/08/2020 PCP: Freddy Finner, NP Referred by: Freddy Finner, NP  Subjective: Chief Complaint  Patient presents with  . Neck - Pain   HPI:  Jeffrey Bryan is a 39 y.o. male who comes in today at the request of Dr. Naaman Plummer for planned Right C7-T1 Cervical epidural steroid injection with fluoroscopic guidance.  The patient has failed conservative care including home exercise, medications, time and activity modification.  This injection will be diagnostic and hopefully therapeutic.  Please see requesting physician notes for further details and justification.  MRI reviewed with images and spine model.  MRI reviewed in the note below.  Referring doctor: Darreld Mclean, MD  We will see how he does in a couple weeks after injection.  Depending on that would look at repeat injection.  ROS Otherwise per HPI.  Assessment & Plan: Visit Diagnoses:  1. Cervical disc disorder with radiculopathy     Plan: No additional findings.   Meds & Orders:  Meds ordered this encounter  Medications  . methylPREDNISolone acetate (DEPO-MEDROL) injection 80 mg    Orders Placed This Encounter  Procedures  . XR C-ARM NO REPORT  . Epidural Steroid injection    Follow-up: Return if symptoms worsen or fail to improve.   Procedures: No procedures performed  Cervical Epidural Steroid Injection - Interlaminar Approach with Fluoroscopic Guidance  Patient: Jeffrey Bryan      Date of Birth: 04-02-1981 MRN: [id number] PCP: Freddy Finner, NP      Visit Date: 05/08/2020   Universal Protocol:    Date/Time: 09/24/215:43 AM  Consent Given By: the patient  Position: PRONE  Additional Comments: Vital signs were monitored before and after the procedure. Patient was prepped and draped in the usual sterile fashion. The correct patient, procedure, and site was verified.   Injection Procedure Details:    Procedure Site One Meds Administered:  Meds ordered this encounter  Medications  . methylPREDNISolone acetate (DEPO-MEDROL) injection 80 mg     Laterality: Right  Location/Site: C7-T1  Needle size: 20 G  Needle type: Touhy  Needle Placement: Paramedian epidural space  Findings:  -Comments: Excellent flow of contrast into the epidural space.  Procedure Details: Using a paramedian approach from the side mentioned above, the region overlying the inferior lamina was localized under fluoroscopic visualization and the soft tissues overlying this structure were infiltrated with 4 ml. of 1% Lidocaine without Epinephrine. A # 20 gauge, Tuohy needle was inserted into the epidural space using a paramedian approach.  The epidural space was localized using loss of resistance along with lateral and contralateral oblique bi-planar fluoroscopic views.  After negative aspirate for air, blood, and CSF, a 2 ml. volume of Isovue-250 was injected into the epidural space and the flow of contrast was observed. Radiographs were obtained for documentation purposes.   The injectate was administered into the level noted above.  Additional Comments:  The patient tolerated the procedure well Dressing: 2 x 2 sterile gauze and Band-Aid    Post-procedure details: Patient was observed during the procedure. Post-procedure instructions were reviewed.  Patient left the clinic in stable condition.     Clinical History: Cervical radiculopathy.  EXAM: MRI CERVICAL SPINE WITHOUT CONTRAST  TECHNIQUE: Multiplanar, multisequence MR imaging of the cervical spine was performed. No intravenous contrast was administered.  COMPARISON:  03/08/2020 cervical spine radiographs.  FINDINGS: Alignment: Straightening of  cervical lordosis.  Vertebrae: Normal bone marrow signal intensity. No focal osseous lesion.  Cord: Normal signal and morphology.  Posterior Fossa, vertebral arteries: Negative.  Disc  levels: Multilevel desiccation with mild disc space loss. Congenital spinal canal narrowing.  C2-3: Patent spinal canal and bilateral neural foramina.  C3-4: Disc osteophyte complex with superimposed small central protrusion and uncovertebral hypertrophy. Patent spinal canal and left neural foramen. Mild right neural foraminal narrowing.  C4-5: Disc osteophyte complex abutting the ventral cord with uncovertebral and bilateral facet degenerative spurring. Small superimposed left subarticular protrusion. Mild spinal canal and bilateral neural foraminal narrowing.  C5-6: Disc osteophyte complex with superimposed central protrusion and uncovertebral degenerative spurring. Mild spinal canal and bilateral neural foraminal narrowing.  C6-7: Patent spinal canal and bilateral neural foramen.  C7-T1: Bilateral uncovertebral degenerative spurring. Patent spinal canal and bilateral neural foramen.  Paraspinal tissues: Within normal limits.  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  Mild right C3-4 and bilateral C4-6 neural foraminal narrowing.  Mild C4-6 spinal canal narrowing.   Electronically Signed   By: Stana Bunting M.D.   On: 04/08/2020 10:16     Objective:  VS:  HT:    WT:   BMI:     BP:109/73  HR:72bpm  TEMP: ( )  RESP:  Physical Exam Vitals and nursing note reviewed.  Constitutional:      General: He is not in acute distress.    Appearance: Normal appearance. He is not ill-appearing.  HENT:     Head: Normocephalic and atraumatic.     Right Ear: External ear normal.     Left Ear: External ear normal.  Eyes:     Extraocular Movements: Extraocular movements intact.  Cardiovascular:     Rate and Rhythm: Normal rate.     Pulses: Normal pulses.  Abdominal:     General: There is no distension.     Palpations: Abdomen is soft.  Musculoskeletal:        General: No signs of injury.     Cervical back: Neck  supple. Tenderness present. No rigidity.     Right lower leg: No edema.     Left lower leg: No edema.     Comments: Patient has good strength in the upper extremities with 5 out of 5 strength in wrist extension long finger flexion APB.  No intrinsic hand muscle atrophy.  Negative Hoffmann's test.  Lymphadenopathy:     Cervical: No cervical adenopathy.  Skin:    Findings: No erythema or rash.  Neurological:     General: No focal deficit present.     Mental Status: He is alert and oriented to person, place, and time.     Sensory: No sensory deficit.     Motor: No weakness or abnormal muscle tone.     Coordination: Coordination normal.  Psychiatric:        Mood and Affect: Mood normal.        Behavior: Behavior normal.      Imaging: No results found.

## 2020-05-16 NOTE — Procedures (Signed)
Cervical Epidural Steroid Injection - Interlaminar Approach with Fluoroscopic Guidance  Patient: Jeffrey Bryan      Date of Birth: [birth year] MRN: [id number] PCP: Freddy Finner, NP      Visit Date: 05/08/2020   Universal Protocol:    Date/Time: 09/24/215:43 AM  Consent Given By: the patient  Position: PRONE  Additional Comments: Vital signs were monitored before and after the procedure. Patient was prepped and draped in the usual sterile fashion. The correct patient, procedure, and site was verified.   Injection Procedure Details:  Procedure Site One Meds Administered:  Meds ordered this encounter  Medications  . methylPREDNISolone acetate (DEPO-MEDROL) injection 80 mg     Laterality: Right  Location/Site: C7-T1  Needle size: 20 G  Needle type: Touhy  Needle Placement: Paramedian epidural space  Findings:  -Comments: Excellent flow of contrast into the epidural space.  Procedure Details: Using a paramedian approach from the side mentioned above, the region overlying the inferior lamina was localized under fluoroscopic visualization and the soft tissues overlying this structure were infiltrated with 4 ml. of 1% Lidocaine without Epinephrine. A # 20 gauge, Tuohy needle was inserted into the epidural space using a paramedian approach.  The epidural space was localized using loss of resistance along with lateral and contralateral oblique bi-planar fluoroscopic views.  After negative aspirate for air, blood, and CSF, a 2 ml. volume of Isovue-250 was injected into the epidural space and the flow of contrast was observed. Radiographs were obtained for documentation purposes.   The injectate was administered into the level noted above.  Additional Comments:  The patient tolerated the procedure well Dressing: 2 x 2 sterile gauze and Band-Aid    Post-procedure details: Patient was observed during the procedure. Post-procedure instructions were reviewed.  Patient left the  clinic in stable condition.

## 2020-06-11 ENCOUNTER — Telehealth (INDEPENDENT_AMBULATORY_CARE_PROVIDER_SITE_OTHER): Payer: Self-pay | Admitting: Family Medicine

## 2020-06-11 ENCOUNTER — Encounter: Payer: Self-pay | Admitting: Family Medicine

## 2020-06-11 ENCOUNTER — Other Ambulatory Visit: Payer: Self-pay

## 2020-06-11 VITALS — BP 109/73 | Ht 65.0 in | Wt 130.0 lb

## 2020-06-11 DIAGNOSIS — M5412 Radiculopathy, cervical region: Secondary | ICD-10-CM

## 2020-06-11 NOTE — Progress Notes (Signed)
Virtual Visit via Telephone Note   This visit type was conducted due to national recommendations for restrictions regarding the COVID-19 Pandemic (e.g. social distancing) in an effort to limit this patient's exposure and mitigate transmission in our community.  Due to his co-morbid illnesses, this patient is at least at moderate risk for complications without adequate follow up.  This format is felt to be most appropriate for this patient at this time.  The patient did not have access to video technology/had technical difficulties with video requiring transitioning to audio format only (telephone).  All issues noted in this document were discussed and addressed.  No physical exam could be performed with this format.    Evaluation Performed:  Follow-up visit  Date:  06/11/2020   ID:  Jeffrey Bryan, DOB Mar 08, 1981, MRN [id number]  Patient Location: Home Provider Location: Office/Clinic  Location of Patient: Home Location of Provider: Telehealth Consent was obtain for visit to be over via telehealth. I verified that I am speaking with the correct person using two identifiers.  PCP:  Freddy Finner, NP   Chief Complaint:  Shoulder pain   History of Present Illness:    Jeffrey Bryan is a 39 y.o. male with neck and right side shoulder weakness and paresthesias.  He presents today to follow up on his referrals and treatments. He reports they are going well and he is feeling better.  MRI showed spinal canal narrowing and spondylosis. He was sent to Dr Alvester Morin for infection and reports feeling better than he was. He will continue to follow up with [facility] as needed.  He wants to make an appt for anxiety today as well.  The patient does not have symptoms concerning for COVID-19 infection (fever, chills, cough, or new shortness of breath).   Past Medical, Surgical, Social History, Allergies, and Medications have been Reviewed.  Past Medical History:  Diagnosis Date  . Seizures (HCC)     No past surgical history on file.   No outpatient medications have been marked as taking for the 06/11/20 encounter (Video Visit) with Freddy Finner, NP.     Allergies:   Tramadol   ROS:   Please see the history of present illness.    All other systems reviewed and are negative.   Labs/Other Tests and Data Reviewed:    Recent Labs: No results found for requested labs within last 8760 hours.   Recent Lipid Panel No results found for: CHOL, TRIG, HDL, CHOLHDL, LDLCALC, LDLDIRECT  Wt Readings from Last 3 Encounters:  06/11/20 130 lb (59 kg)  04/10/20 130 lb (59 kg)  03/20/20 133 lb (60.3 kg)     Objective:    Vital Signs:  BP 109/73   Ht 5\' 5"  (1.651 m)   Wt 130 lb (59 kg)   BMI 21.63 kg/m    VITAL SIGNS:  reviewed GEN:  no acute distress RESPIRATORY:  no shortness of breath in conversation  PSYCH:  normal affect and mood   ASSESSMENT & PLAN:     1. Cervical radiculopathy   Time:   Today, I have spent 5 minutes with the patient with telehealth technology discussing the above problems.     Medication Adjustments/Labs and Tests Ordered: Current medicines are reviewed at length with the patient today.  Concerns regarding medicines are outlined above.   Tests Ordered: No orders of the defined types were placed in this encounter.   Medication Changes: No orders of the defined types were placed  in this encounter.    Note: This dictation was prepared with Dragon dictation along with smaller phrase technology. Similar sounding words can be transcribed inadequately or may not be corrected upon review. Any transcriptional errors that result from this process are unintentional.      Disposition:  Follow up 2-3 weeks for anxiety  Signed, Freddy Finner, NP  06/11/2020 2:55 PM     [facility]

## 2020-06-11 NOTE — Patient Instructions (Signed)
  HAPPY FALL!  I appreciate the opportunity to provide you with care for your health and wellness. Today we discussed: shoulder pain  Follow up: Per patient time frame- appt for anxiety   No labs or referrals today  Work note  Financial Information  Please continue to practice social distancing to keep you, your family, and our community safe.  If you must go out, please wear a mask and practice good handwashing.  It was a pleasure to see you and I look forward to continuing to work together on your health and well-being. Please do not hesitate to call the office if you need care or have questions about your care.  Have a wonderful day and week. With Gratitude, Tereasa Coop, DNP, AGNP-BC

## 2020-06-11 NOTE — Assessment & Plan Note (Signed)
MRI showed spinal canal narrowing and spondylosis. He was sent to Dr Alvester Morin for infection and reports feeling better than he was. He will continue to follow up with [facility] as needed.

## 2021-11-23 ENCOUNTER — Encounter (HOSPITAL_COMMUNITY): Payer: Self-pay | Admitting: *Deleted

## 2021-11-23 ENCOUNTER — Emergency Department (HOSPITAL_COMMUNITY)
Admission: EM | Admit: 2021-11-23 | Discharge: 2021-11-23 | Disposition: A | Discharge status: Home / Self Care | Payer: Self-pay | Attending: Emergency Medicine | Admitting: Emergency Medicine

## 2021-11-23 DIAGNOSIS — K047 Periapical abscess without sinus: Secondary | ICD-10-CM

## 2021-11-23 DIAGNOSIS — K0889 Other specified disorders of teeth and supporting structures: Secondary | ICD-10-CM | POA: Insufficient documentation

## 2021-11-23 MED ORDER — AMOXICILLIN-POT CLAVULANATE 875-125 MG PO TABS: 1.0000 | ORAL_TABLET | Freq: Two times a day (BID) | ORAL | 0 refills | Status: DC

## 2021-11-23 MED ORDER — IBUPROFEN 800 MG PO TABS: 800.0000 mg | ORAL_TABLET | Freq: Three times a day (TID) | ORAL | 0 refills | Status: DC

## 2021-11-23 MED ORDER — KETOROLAC TROMETHAMINE 60 MG/2ML IM SOLN
60.0000 mg | Freq: Once | INTRAMUSCULAR | Status: AC
  Administered 2021-11-23: 60 mg via INTRAMUSCULAR
  Filled 2021-11-23: qty 2

## 2021-11-23 MED ORDER — AMOXICILLIN-POT CLAVULANATE 875-125 MG PO TABS
1.0000 | ORAL_TABLET | Freq: Once | ORAL | Status: AC
  Administered 2021-11-23: 1 via ORAL
  Filled 2021-11-23: qty 1

## 2021-11-23 NOTE — Discharge Instructions (Addendum)
Take the antibiotic twice daily for the next week. ?You can take 800 of ibuprofen 3 times daily. ?Called the dentist listed above, set up an appointment for reevaluation.  Return to the ED if you are unable to swallow, swelling worsens, you have new symptoms. ?

## 2021-11-23 NOTE — ED Triage Notes (Signed)
Pain in right lower side of mouth for the past 2 days ?

## 2021-11-23 NOTE — ED Provider Notes (Signed)
?[facility] [facility] EMERGENCY DEPARTMENT ?Provider Note ? ? ?CSN: [id number] ?Arrival date & time: 11/23/21  1715 ? ?  ? ?History ? ?Chief Complaint  ?Patient presents with  ? Oral Pain  ? ? ?Jeffrey Bryan is a 41 y.o. male. ? ? ?Oral Pain ? ? ?Patient presents with dental pain x2 days.  Not having any fevers, pain is constant.  He has tried Tylenol with minimal relief.  He also states he tried drinking tequila to help dull the pain which did not help.  Not having fevers, no drainage. ? ?Home Medications ?Prior to Admission medications   ?Not on File  ?   ? ?Allergies    ?Tramadol   ? ?Review of Systems   ?Review of Systems ? ?Physical Exam ?Updated Vital Signs ?BP (!) 125/93 (BP Location: Right Arm)   Pulse 64   Temp 97.9 ?F (36.6 ?C) (Oral)   Resp 14   Ht 5\' 5"  (1.651 m)   Wt 65.3 kg   SpO2 100%   BMI 23.96 kg/m?  ?Physical Exam ?Vitals and nursing note reviewed. Exam conducted with a chaperone present.  ?Constitutional:   ?   Appearance: Normal appearance.  ?HENT:  ?   Head: Normocephalic.  ?   Mouth/Throat:  ?   Mouth: Mucous membranes are moist.  ?   Pharynx: No oropharyngeal exudate or posterior oropharyngeal erythema.  ? ?   Comments: No sublingual tenderness or swelling. Uvula is midline. No trismus. No dry sockets or pulp exposure. Handling secretions without difficulty.  Area of fluctuance consistent with a dental abscess ?Eyes:  ?   Extraocular Movements: Extraocular movements intact.  ?   Pupils: Pupils are equal, round, and reactive to light.  ?Cardiovascular:  ?   Rate and Rhythm: Normal rate and regular rhythm.  ?Pulmonary:  ?   Effort: Pulmonary effort is normal.  ?   Breath sounds: Normal breath sounds.  ?Musculoskeletal:  ?   Cervical back: Normal range of motion. No rigidity or tenderness.  ?Neurological:  ?   Mental Status: He is alert.  ?Psychiatric:     ?   Mood and Affect: Mood normal.  ? ? ?ED Results / Procedures / Treatments   ?Labs ?(all labs ordered are listed, but only abnormal results are  displayed) ?Labs Reviewed - No data to display ? ?EKG ?None ? ?Radiology ?No results found. ? ?Procedures ?Procedures  ? ? ?Medications Ordered in ED ?Medications  ?amoxicillin-clavulanate (AUGMENTIN) 875-125 MG per tablet 1 tablet (has no administration in time range)  ?ketorolac (TORADOL) injection 60 mg (has no administration in time range)  ? ? ?ED Course/ Medical Decision Making/ A&P ?  ?                        ?Medical Decision Making ?Risk ?Prescription drug management. ? ? ?41 year old presenting with dental pain. ? ?Social determinant of health: Does not have dental insurance ? ?Considered but ultimately think unlikely this [place] angina, retropharyngeal abscess, PTA, deeper space infection.  Exam is most consistent with a early dental abscess. ? ?I ordered patient shot of Toradol to help with pain, first dose of Augmentin given here in the ED.  Will discharge with antibiotics and dental referral. ? ? ? ? ? ? ? ?Final Clinical Impression(s) / ED Diagnoses ?Final diagnoses:  ?None  ? ? ?Rx / DC Orders ?ED Discharge Orders   ? ? None  ? ?  ? ? ?  ?24,  Rolly Salter, PA-C ?11/23/21 1812 ? ?  ?Mancel Bale, MD ?11/24/21 1155 ? ?

## 2022-04-25 IMAGING — MR MR CERVICAL SPINE W/O CM
5 series · 37 of 48 positions shown · non-contrast
Comparison: 03/08/2020 cervical spine radiographs.

CLINICAL DATA: Cervical radiculopathy.

EXAM:
MRI CERVICAL SPINE WITHOUT CONTRAST
TECHNIQUE: Multiplanar, multisequence MR imaging of the cervical spine was
performed. No intravenous contrast was administered.

[Series 5: T2 · sagittal · 3.0mm · 0.69mm/px · 6 of 15 slices shown (1 of 2)]
[im 1/15]
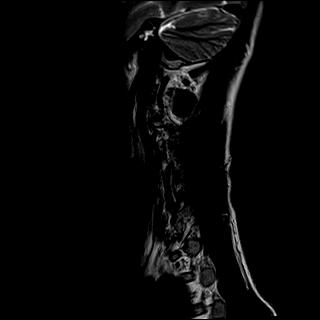
[im 3/15]
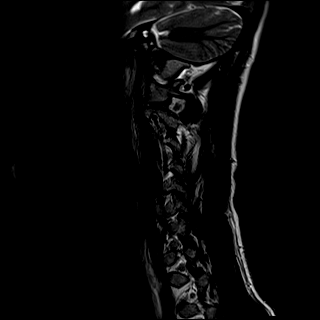
[im 6/15]
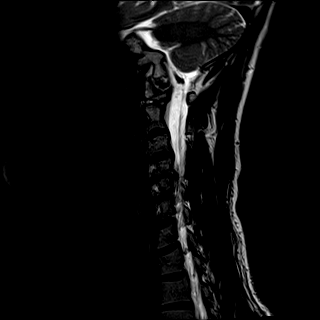
[im 9/15]
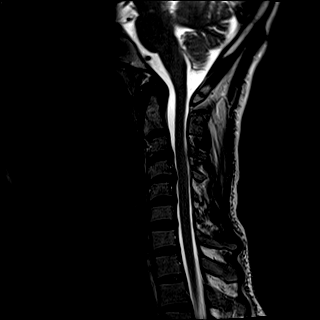
[im 12/15]
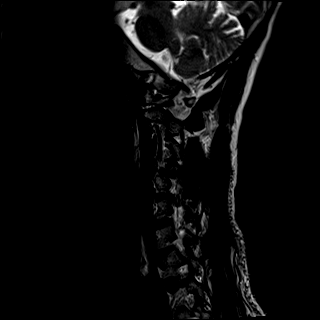
[im 15/15]
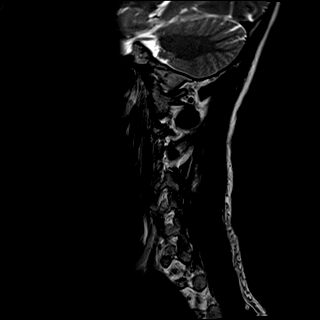

[Series 6: T1 · sagittal · 3.0mm · 0.69mm/px · 6 of 15 slices shown]
[im 1/15]
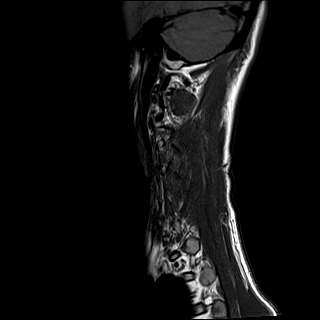
[im 3/15]
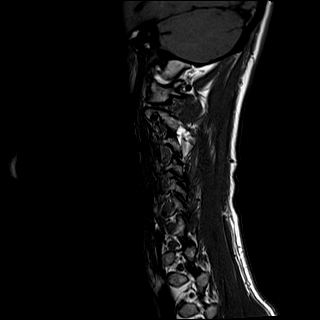
[im 6/15]
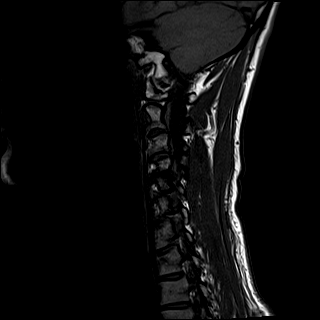
[im 9/15]
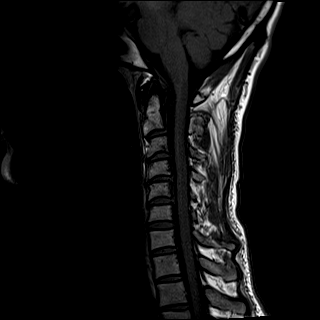
[im 12/15]
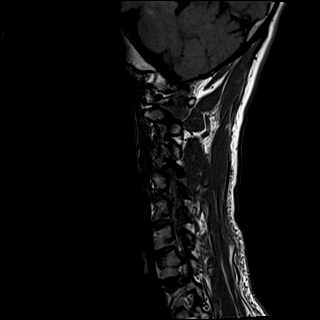
[im 15/15]
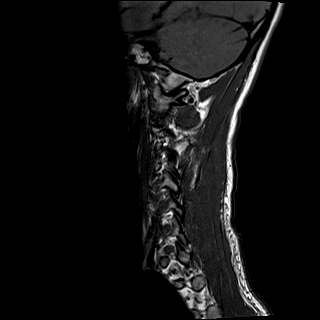

[Series 7: STIR · sagittal · 3.0mm · 0.86mm/px · 6 of 15 slices shown]
[im 1/15]
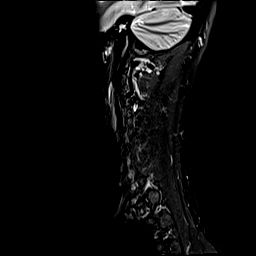
[im 3/15]
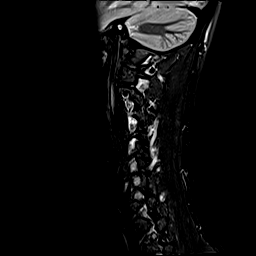
[im 6/15]
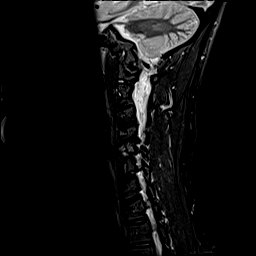
[im 9/15]
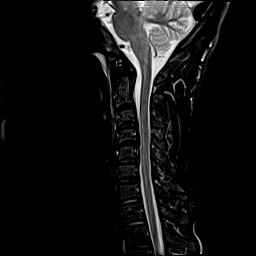
[im 12/15]
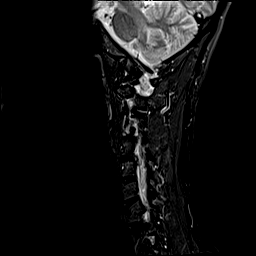
[im 15/15]
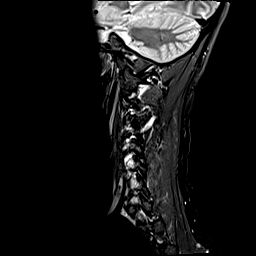

[Series 8: T2 · axial · 3.0mm · 0.66mm/px · z∈[-68,+42]mm · 11 of 36 slices shown (2 of 2)]
[im 1/36]
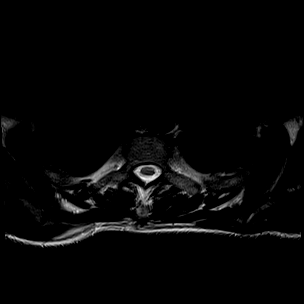
[im 3/36]
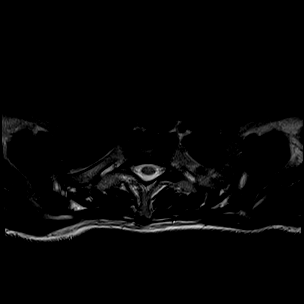
[im 6/36]
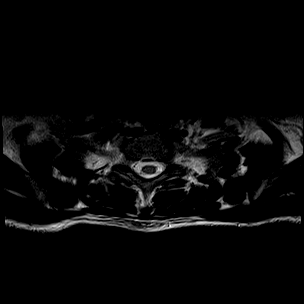
[im 8/36]
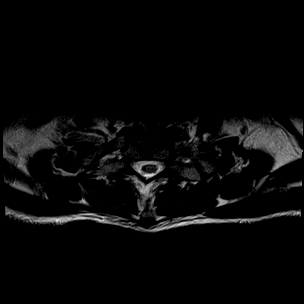
[im 11/36]
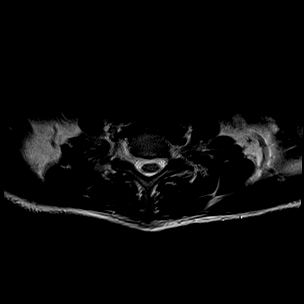
[im 16/36]
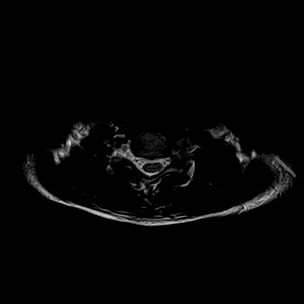
[im 18/36]
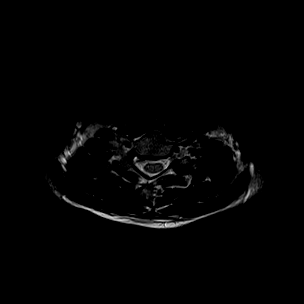
[im 21/36]
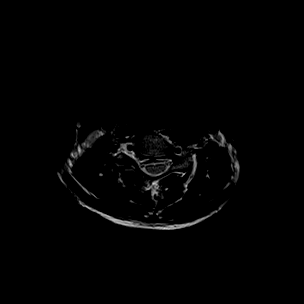
[im 26/36]
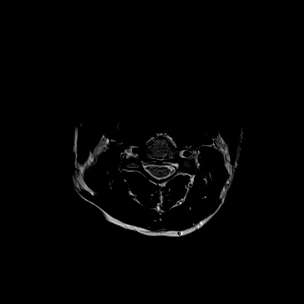
[im 31/36]
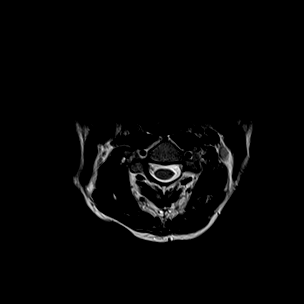
[im 36/36]
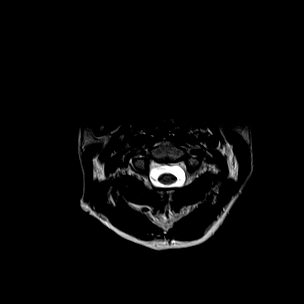

[Series 9: GRE · axial · 3.0mm · 0.39mm/px · z∈[-68,+42]mm · 8 of 36 slices shown]
[im 1/36]
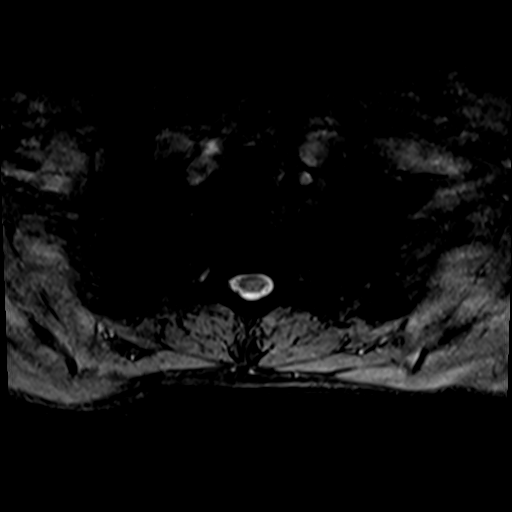
[im 6/36]
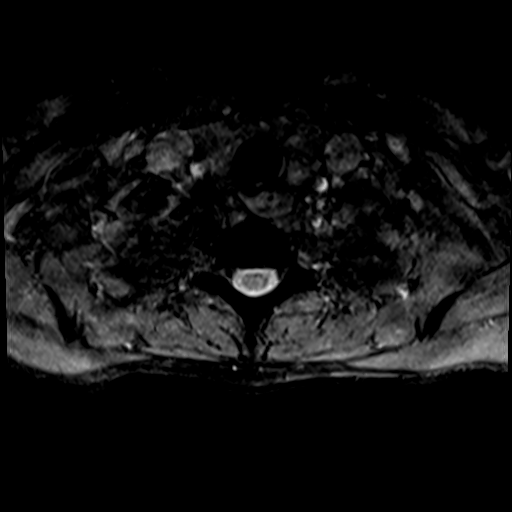
[im 11/36]
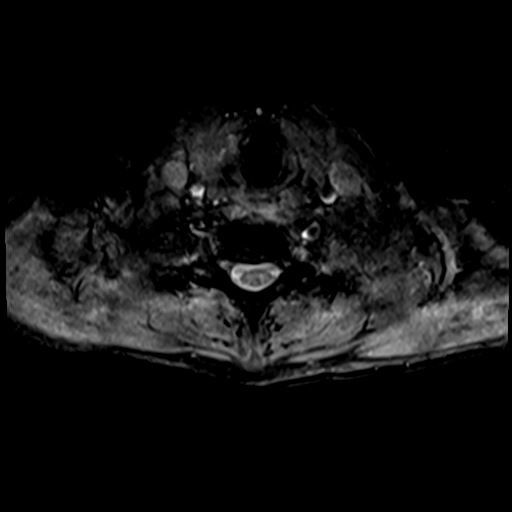
[im 16/36]
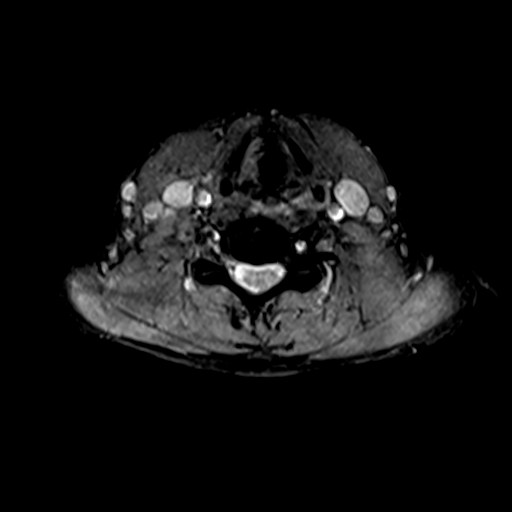
[im 21/36]
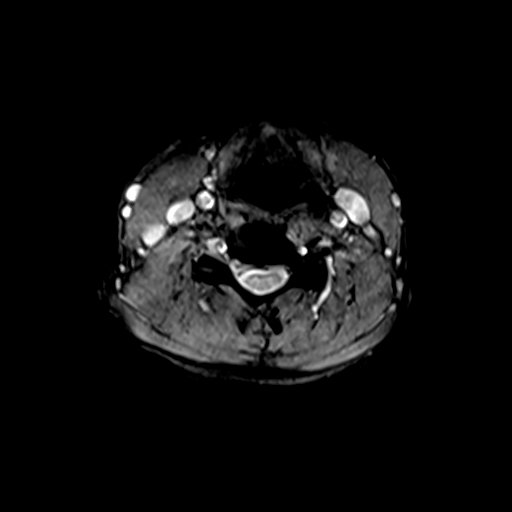
[im 26/36]
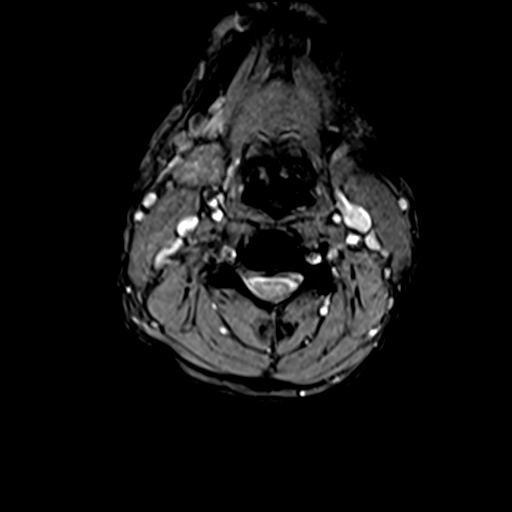
[im 31/36]
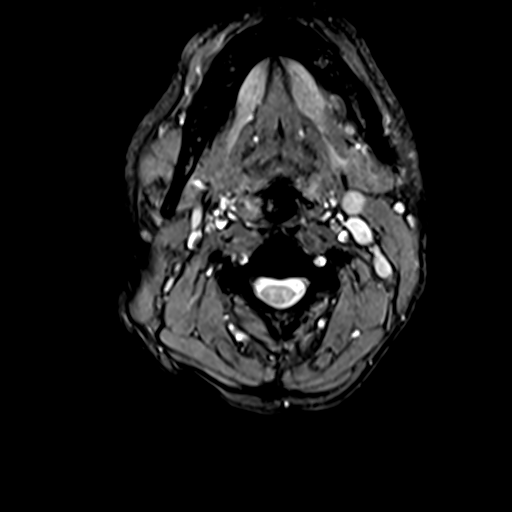
[im 36/36]
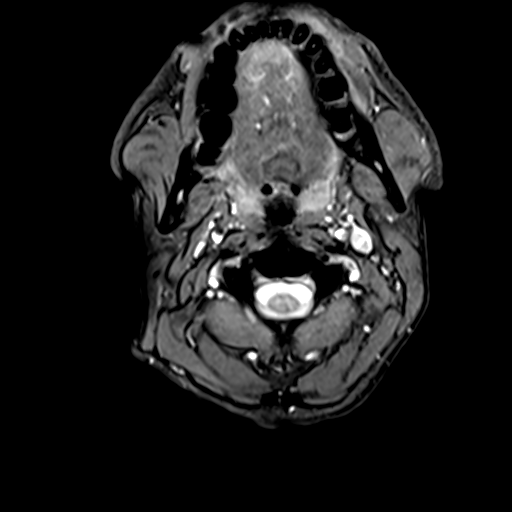

[37 of 48 positions shown; findings below may reference images not displayed]

FINDINGS: Alignment: Straightening of cervical lordosis.

Vertebrae: Normal bone marrow signal intensity. No focal osseous
lesion.

Cord: Normal signal and morphology.

Posterior Fossa, vertebral arteries: Negative.

Disc levels: Multilevel desiccation with mild disc space loss.
Congenital spinal canal narrowing.

C2-3: Patent spinal canal and bilateral neural foramina.

C3-4: Disc osteophyte complex with superimposed small central
protrusion and uncovertebral hypertrophy. Patent spinal canal and
left neural foramen. Mild right neural foraminal narrowing.

C4-5: Disc osteophyte complex abutting the ventral cord with
uncovertebral and bilateral facet degenerative spurring. Small
superimposed left subarticular protrusion. Mild spinal canal and
bilateral neural foraminal narrowing.

C5-6: Disc osteophyte complex with superimposed central protrusion
and uncovertebral degenerative spurring. Mild spinal canal and
bilateral neural foraminal narrowing.

C6-7: Patent spinal canal and bilateral neural foramen.

C7-T1: Bilateral uncovertebral degenerative spurring. Patent spinal
canal and bilateral neural foramen.

Paraspinal tissues: Within normal limits.
IMPRESSION: Congenital spinal canal narrowing with superimposed spondylosis.

Mild right C3-4 and bilateral C4-6 neural foraminal narrowing.

Mild C4-6 spinal canal narrowing.

## 2024-03-25 ENCOUNTER — Other Ambulatory Visit: Payer: Self-pay

## 2024-03-25 ENCOUNTER — Emergency Department (HOSPITAL_COMMUNITY): Payer: Self-pay

## 2024-03-25 ENCOUNTER — Encounter (HOSPITAL_COMMUNITY): Payer: Self-pay | Admitting: Emergency Medicine

## 2024-03-25 ENCOUNTER — Emergency Department (HOSPITAL_COMMUNITY)
Admission: EM | Admit: 2024-03-25 | Discharge: 2024-03-26 | Disposition: A | Discharge status: Home / Self Care | Payer: Self-pay | Attending: Emergency Medicine | Admitting: Emergency Medicine

## 2024-03-25 DIAGNOSIS — F17210 Nicotine dependence, cigarettes, uncomplicated: Secondary | ICD-10-CM | POA: Insufficient documentation

## 2024-03-25 DIAGNOSIS — R202 Paresthesia of skin: Secondary | ICD-10-CM | POA: Insufficient documentation

## 2024-03-25 DIAGNOSIS — R531 Weakness: Secondary | ICD-10-CM | POA: Insufficient documentation

## 2024-03-25 DIAGNOSIS — M25522 Pain in left elbow: Secondary | ICD-10-CM | POA: Insufficient documentation

## 2024-03-25 NOTE — ED Provider Notes (Signed)
 [place] EMERGENCY DEPARTMENT AT [facility] Provider Note   CSN: [id number] Arrival date & time: 03/25/24  2302     Patient presents with: Arm Pain   Jeffrey Bryan is a 43 y.o. male.  {Add pertinent medical, surgical, social history, OB history to YEP:67052} The patient presents with elbow pain that began approximately three weeks ago. Initially, the pain was mild and attributed to aging, but it has progressively worsened, becoming unbearable over the past week. The pain is described as a pinched nerve sensation, sometimes accompanied by a cramp-like feeling in the neck. The patient reports numbness and weakness, particularly on the affected side, which is exacerbated by movement. There is no history of trauma or injury to the area. The patient has been managing the pain with Advil  and ibuprofen , which provide some relief but do not fully alleviate the symptoms. There are no associated fevers, vomiting, or diarrhea. The patient denies any history of similar joint pain, alcohol use, or drug injections, though he admits to occasional marijuana use and smoking cigarettes. The pain is localized behind the elbow and is not tender to touch, but there is a noticeable decrease in strength compared to the unaffected side. 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 History was obtained from the patient.   Arm Pain       Prior to Admission medications   Medication Sig Start Date End Date Taking? Authorizing Provider  amoxicillin -clavulanate (AUGMENTIN ) 875-125 MG tablet Take 1 tablet by mouth every 12 (twelve) hours. 11/23/21   Emelia Sluder, PA-C  ibuprofen  (ADVIL ) 800 MG tablet Take 1 tablet (800 mg total) by mouth 3 (three) times daily. 11/23/21   Emelia Sluder, PA-C    Allergies: Tramadol    Review of Systems  Updated Vital Signs BP 112/76   Pulse (!) 56   Temp (!) 97.4 F (36.3 C) (Oral)   Resp 17   Ht 5' 4 (1.626 m)   Wt 56.7 kg   SpO2  100%   BMI 21.46 kg/m   Physical Exam Vitals and nursing note reviewed.  Constitutional:      Appearance: He is well-developed.  HENT:     Head: Normocephalic and atraumatic.  Cardiovascular:     Rate and Rhythm: Normal rate.  Pulmonary:     Effort: Pulmonary effort is normal. No respiratory distress.  Abdominal:     General: There is no distension.  Musculoskeletal:        General: Normal range of motion.     Cervical back: Normal range of motion.     Comments: Left tricep strength significantly reduced compared to right.   Left elbow without significant pain with ROM, erythema, edema, warmth or ttp  Neurological:     Mental Status: He is alert.     (all labs ordered are listed, but only abnormal results are displayed) Labs Reviewed - No data to display  EKG: None  Radiology: No results found.  {Document cardiac monitor, telemetry assessment procedure when appropriate:32947} Procedures   Medications Ordered in the ED - No data to display    {Click here for ABCD2, HEART and other calculators REFRESH Note before signing:1}                              Medical Decision Making Amount and/or Complexity of Data Reviewed Radiology: ordered.   Initial Evaluation:  - Patient presents with elbow  pain that started three weeks ago, worsened to unbearable in the last week, described as feeling like a pinched nerve, with no trauma or systemic symptoms.  Plan:  - Obtain X-ray to rule out obvious conditions like fracture, osseous lesion, tendon rupture - Obtain CT cervical to evaluate for possible causes for pinched nerve - Rest the elbow, potentially using a sling for support - Take ibuprofen  consistently, two to three times a day for inflammation management - Monitor for improvement over 4-5 days; if no improvement after 4 weeks, referral to orthopedics for MRI - Unlikely that current symptoms indicate infection, gout, or rheumatoid arthritis due to lack of swelling,  fever, IVDA, ttp or redness  ***  {Document critical care time when appropriate  Document review of labs and clinical decision tools ie CHADS2VASC2, etc  Document your independent review of radiology images and any outside records  Document your discussion with family members, caretakers and with consultants  Document social determinants of health affecting pt's care  Document your decision making why or why not admission, treatments were needed:32947:::1}   Final diagnoses:  None    ED Discharge Orders     None

## 2024-03-25 NOTE — ED Triage Notes (Signed)
 Pt c/o left elbow pain x 3 weeks. Pt states his arm goes numb at times.

## 2024-03-25 NOTE — ED Notes (Signed)
 Patient transported to CT

## 2024-03-26 ENCOUNTER — Ambulatory Visit: Payer: Self-pay

## 2024-03-26 ENCOUNTER — Telehealth: Payer: Self-pay | Admitting: Internal Medicine

## 2024-03-26 MED ORDER — IBUPROFEN 800 MG PO TABS: 800.0000 mg | ORAL_TABLET | Freq: Three times a day (TID) | ORAL | 0 refills | Status: DC

## 2024-03-26 NOTE — Telephone Encounter (Signed)
 Patient has not established care. Advised to be evaluated at [facility] and then call to schedule a new patient appointment. Patient agrees.  FYI Only or Action Required?: FYI only for provider.  Patient was last seen in primary care on NA.  Called Nurse Triage reporting Arm Pain.  Symptoms began several weeks ago.  Interventions attempted: OTC medications: Aleve  and ibuprofen  - advised by this RN to NOT take both as they are both NSAIDs. Patient verbalized understanding.  Symptoms are: gradually worsening.  Triage Disposition: See PCP When Office is Open (Within 3 Days)  Patient/caregiver understands and will follow disposition?: Yes Reason for Disposition  [1] MODERATE pain (e.g., interferes with normal activities) AND [2] present > 3 days  Answer Assessment - Initial Assessment Questions 1. ONSET: When did the pain start?     Several weeks ago started as tingling, worsening.   2. LOCATION: Where is the pain located?     Lower arm, radiates to neck.  3. PAIN: How bad is the pain? (Scale 0-10; or none, mild, moderate, severe)     7/10  4. WORK OR EXERCISE: Has there been any recent work or exercise that involved this part of the body?     Denies  5. CAUSE: What do you think is causing the arm pain?     Unknown  6. OTHER SYMPTOMS: Do you have any other symptoms? (e.g., neck pain, swelling, rash, fever, numbness, weakness)     Denies  Protocols used: Arm Pain-A-AH Copied from CRM #[id number]. Topic: Clinical - Red Word Triage >> Mar 26, 2024 10:56 AM Willma SAUNDERS wrote: Red Word that prompted transfer to Nurse Triage: Patient was in the ED yesterday for pain and tingling in his left arm. States is still experiencing the symptoms.

## 2024-03-26 NOTE — Telephone Encounter (Signed)
 Copied from CRM #[id number]. Topic: Appointments - Appointment Scheduling >> Mar 26, 2024 10:37 AM [name] wrote: Patient needs ED follow up- left arm tingling and pain- ER states he may need an MRI- please call patient [phone]

## 2024-03-26 NOTE — Telephone Encounter (Signed)
 LVM for pt to call back and schedule.

## 2024-08-28 ENCOUNTER — Encounter: Payer: Self-pay | Admitting: Emergency Medicine

## 2024-08-28 ENCOUNTER — Other Ambulatory Visit: Payer: Self-pay

## 2024-08-28 DIAGNOSIS — K029 Dental caries, unspecified: Secondary | ICD-10-CM | POA: Insufficient documentation

## 2024-08-28 NOTE — ED Triage Notes (Addendum)
 Pt arrives POV ambulatory to triage, gait steady, no acute distress noted c/o right sided jaw pain x 4 days, relieved by ibuprofen  but pain is not going away, pt denies dental pain or radiation anywhere. Last dose of ibuprofen  was 1200

## 2024-08-29 ENCOUNTER — Emergency Department
Admission: EM | Admit: 2024-08-29 | Discharge: 2024-08-29 | Disposition: A | Discharge status: Home / Self Care | Payer: Self-pay | Attending: Emergency Medicine | Admitting: Emergency Medicine

## 2024-08-29 DIAGNOSIS — K029 Dental caries, unspecified: Secondary | ICD-10-CM

## 2024-08-29 MED ORDER — AMOXICILLIN-POT CLAVULANATE 875-125 MG PO TABS
1.0000 | ORAL_TABLET | Freq: Once | ORAL | Status: AC
  Administered 2024-08-29: 1 via ORAL
  Filled 2024-08-29: qty 1

## 2024-08-29 MED ORDER — OXYCODONE HCL 5 MG PO TABS
5.0000 mg | ORAL_TABLET | Freq: Once | ORAL | Status: AC
  Administered 2024-08-29: 5 mg via ORAL
  Filled 2024-08-29: qty 1

## 2024-08-29 MED ORDER — AMOXICILLIN-POT CLAVULANATE 875-125 MG PO TABS: 1.0000 | ORAL_TABLET | Freq: Two times a day (BID) | ORAL | 0 refills | Status: AC

## 2024-08-29 MED ORDER — IBUPROFEN 800 MG PO TABS: 800.0000 mg | ORAL_TABLET | Freq: Three times a day (TID) | ORAL | 0 refills | Status: AC | PRN

## 2024-08-29 MED ORDER — IBUPROFEN 800 MG PO TABS
800.0000 mg | ORAL_TABLET | Freq: Once | ORAL | Status: AC
  Administered 2024-08-29: 800 mg via ORAL
  Filled 2024-08-29: qty 1

## 2024-08-29 MED ORDER — OXYCODONE HCL 5 MG PO TABS: 5.0000 mg | ORAL_TABLET | Freq: Three times a day (TID) | ORAL | 0 refills | Status: AC | PRN

## 2024-08-29 MED ORDER — ONDANSETRON 4 MG PO TBDP: 4.0000 mg | ORAL_TABLET | Freq: Four times a day (QID) | ORAL | 0 refills | Status: AC | PRN

## 2024-08-29 MED ORDER — ONDANSETRON 4 MG PO TBDP
4.0000 mg | ORAL_TABLET | Freq: Once | ORAL | Status: AC
  Administered 2024-08-29: 4 mg via ORAL
  Filled 2024-08-29: qty 1

## 2024-08-29 NOTE — ED Provider Notes (Signed)
 "  [facility] Provider Note    Event Date/Time   First MD Initiated Contact with Patient 08/29/24 0158     (approximate)   History   Jaw Pain   HPI  Jeffrey Bryan is a 44 y.o. male with history of seizures who presents to the emergency department the right sided jaw pain, dental pain for the past 4 days.  Mild amount of facial swelling.  No fever.  Does not have a dentist.   History provided by patient.    Past Medical History:  Diagnosis Date   Seizures (HCC)     History reviewed. No pertinent surgical history.  MEDICATIONS:  Prior to Admission medications  Medication Sig Start Date End Date Taking? Authorizing Provider  amoxicillin -clavulanate (AUGMENTIN ) 875-125 MG tablet Take 1 tablet by mouth 2 (two) times daily. 08/29/24  Yes Olean Sangster, Josette SAILOR, DO  ibuprofen  (ADVIL ) 800 MG tablet Take 1 tablet (800 mg total) by mouth every 8 (eight) hours as needed. 08/29/24  Yes Marquita Lias, Josette SAILOR, DO  ondansetron  (ZOFRAN -ODT) 4 MG disintegrating tablet Take 1 tablet (4 mg total) by mouth every 6 (six) hours as needed for nausea or vomiting. 08/29/24  Yes Jailen Lung N, DO  oxyCODONE  (ROXICODONE ) 5 MG immediate release tablet Take 1 tablet (5 mg total) by mouth every 8 (eight) hours as needed. 08/29/24 08/29/25 Yes Samaira Holzworth, Josette SAILOR, DO    Physical Exam   Triage Vital Signs: ED Triage Vitals  Encounter Vitals Group     BP 08/28/24 2242 134/82     Girls Systolic BP Percentile --      Girls Diastolic BP Percentile --      Boys Systolic BP Percentile --      Boys Diastolic BP Percentile --      Pulse Rate 08/28/24 2242 67     Resp 08/28/24 2242 18     Temp 08/28/24 2242 98.8 F (37.1 C)     Temp Source 08/28/24 2242 Oral     SpO2 08/29/24 0232 97 %     Weight --      Height --      Head Circumference --      Peak Flow --      Pain Score 08/28/24 2243 8     Pain Loc --      Pain Education --      Exclude from Growth Chart --     Most recent vital  signs: Vitals:   08/28/24 2242 08/29/24 0232  BP: 134/82 (!) 128/102  Pulse: 67 75  Resp: 18 16  Temp: 98.8 F (37.1 C)   SpO2:  97%     CONSTITUTIONAL: Alert and responds appropriately to questions. Well-appearing; well-nourished HEAD: Normocephalic, atraumatic EYES: Conjunctivae clear, pupils appear equal ENT: normal nose; moist mucous membranes; Dental caries present, tender over the right lower teeth and gums, no drainable dental abscess noted, gums appear normal without bleeding, no Ludwig's angina, tongue sits flat in the bottom of the mouth, no angioedema, no facial erythema or warmth, no facial swelling, moves neck without difficulty, normal phonation without stridor, trismus or drooling. NECK: Normal range of motion CARD: Regular rate and rhythm RESP: Normal chest excursion without splinting or tachypnea; no hypoxia or respiratory distress, speaking full sentences ABD/GI: non-distended EXT: Normal ROM in all joints, no major deformities noted SKIN: Normal color for age and race, no rashes on exposed skin NEURO: Moves all extremities equally, normal speech, no facial asymmetry noted PSYCH: The  patient's mood and manner are appropriate. Grooming and personal hygiene are appropriate.  ED Results / Procedures / Treatments   LABS: (all labs ordered are listed, but only abnormal results are displayed) Labs Reviewed - No data to display   EKG:  EKG Interpretation Date/Time:    Ventricular Rate:    PR Interval:    QRS Duration:    QT Interval:    QTC Calculation:   R Axis:      Text Interpretation:            RADIOLOGY: My personal review and interpretation of imaging:    I have personally reviewed all radiology reports. No results found.   PROCEDURES:  Critical Care performed:    CRITICAL CARE Performed by: Josette Sink   Total critical care time: 0 minutes  Critical care time was exclusive of separately billable procedures and treating other  patients.  Critical care was necessary to treat or prevent imminent or life-threatening deterioration.  Critical care was time spent personally by me on the following activities: development of treatment plan with patient and/or surrogate as well as nursing, discussions with consultants, evaluation of patient's response to treatment, examination of patient, obtaining history from patient or surrogate, ordering and performing treatments and interventions, ordering and review of laboratory studies, ordering and review of radiographic studies, pulse oximetry and re-evaluation of patient's condition.   Procedures    IMPRESSION / MDM / ASSESSMENT AND PLAN / ED COURSE  I reviewed the triage vital signs and the nursing notes.   Patient here with right dental pain from dental caries.  Possible developing periapical abscess.  No Ludwig's.     DIFFERENTIAL DIAGNOSIS (includes but not limited to):   Periapical abscess, dental pain from dental caries, no signs of Ludwig's angina or facial cellulitis, I do not think this is his anginal equivalent  Patient's presentation is most consistent with acute complicated illness / injury requiring diagnostic workup.  PLAN: Will give pain medication, start him on antibiotics and give dental follow-up.  No facial cellulitis, airway compromise, Ludwig's angina, signs or symptoms of sepsis.  No chest pain or shortness of breath.  Pain reproducible with palpation over the teeth and gums.  No sign of drainable abscess on exam.   MEDICATIONS GIVEN IN ED: Medications  amoxicillin -clavulanate (AUGMENTIN ) 875-125 MG per tablet 1 tablet (1 tablet Oral Given 08/29/24 0231)  ibuprofen  (ADVIL ) tablet 800 mg (800 mg Oral Given 08/29/24 0231)  oxyCODONE  (Oxy IR/ROXICODONE ) immediate release tablet 5 mg (5 mg Oral Given 08/29/24 0231)  ondansetron  (ZOFRAN -ODT) disintegrating tablet 4 mg (4 mg Oral Given 08/29/24 0229)     ED COURSE:  At this time, I do not feel there is any  life-threatening condition present. I reviewed all nursing notes, vitals, pertinent previous records.  All lab and urine results, EKGs, imaging ordered have been independently reviewed and interpreted by myself.  I reviewed all available radiology reports from any imaging ordered this visit.  Based on my assessment, I feel the patient is safe to be discharged home without further emergent workup and can continue workup as an outpatient as needed. Discussed all findings, treatment plan as well as usual and customary return precautions.  They verbalize understanding and are comfortable with this plan.  Outpatient follow-up has been provided as needed.  All questions have been answered.    CONSULTS:  none   OUTSIDE RECORDS REVIEWED: Reviewed last internal medicine note in August 2025.     FINAL CLINICAL IMPRESSION(S) /  ED DIAGNOSES   Final diagnoses:  Pain due to dental caries     Rx / DC Orders   ED Discharge Orders          Ordered    amoxicillin -clavulanate (AUGMENTIN ) 875-125 MG tablet  2 times daily        08/29/24 0227    ondansetron  (ZOFRAN -ODT) 4 MG disintegrating tablet  Every 6 hours PRN        08/29/24 0227    oxyCODONE  (ROXICODONE ) 5 MG immediate release tablet  Every 8 hours PRN        08/29/24 0227    ibuprofen  (ADVIL ) 800 MG tablet  Every 8 hours PRN        08/29/24 0227             Note:  This document was prepared using Dragon voice recognition software and may include unintentional dictation errors.   Zi Sek, Josette SAILOR, DO 08/29/24 [phone]  "
